# Patient Record
Sex: Female | Born: 1955
Health system: Southern US, Community
[De-identification: ages and names within clinical notes are randomized; demographics above are authoritative.]

## PROBLEM LIST (undated history)

## (undated) DIAGNOSIS — M810 Age-related osteoporosis without current pathological fracture: Secondary | ICD-10-CM

## (undated) DIAGNOSIS — F419 Anxiety disorder, unspecified: Secondary | ICD-10-CM

## (undated) DIAGNOSIS — Z90710 Acquired absence of both cervix and uterus: Secondary | ICD-10-CM

## (undated) DIAGNOSIS — F32A Depression, unspecified: Secondary | ICD-10-CM

## (undated) DIAGNOSIS — T7840XA Allergy, unspecified, initial encounter: Secondary | ICD-10-CM

## (undated) DIAGNOSIS — I10 Essential (primary) hypertension: Secondary | ICD-10-CM

## (undated) DIAGNOSIS — E7800 Pure hypercholesterolemia, unspecified: Secondary | ICD-10-CM

## (undated) DIAGNOSIS — N289 Disorder of kidney and ureter, unspecified: Secondary | ICD-10-CM

## (undated) HISTORY — PX: DILATION AND CURETTAGE OF UTERUS: SHX78

## (undated) HISTORY — PX: ABDOMINAL HYSTERECTOMY: SHX81

---

## 2001-11-30 HISTORY — PX: BREAST BIOPSY: SHX20

## 2005-01-16 ENCOUNTER — Ambulatory Visit: Payer: Self-pay | Admitting: Family Medicine

## 2005-05-07 ENCOUNTER — Ambulatory Visit: Payer: Self-pay | Admitting: Family Medicine

## 2005-05-18 ENCOUNTER — Ambulatory Visit: Payer: Self-pay | Admitting: Family Medicine

## 2006-02-10 ENCOUNTER — Ambulatory Visit: Payer: Self-pay | Admitting: Family Medicine

## 2006-06-08 ENCOUNTER — Ambulatory Visit: Payer: Self-pay | Admitting: Family Medicine

## 2006-06-10 ENCOUNTER — Ambulatory Visit: Payer: Self-pay | Admitting: Family Medicine

## 2007-07-13 ENCOUNTER — Ambulatory Visit: Payer: Self-pay | Admitting: Nurse Practitioner

## 2007-08-30 ENCOUNTER — Ambulatory Visit: Payer: Self-pay | Admitting: Gastroenterology

## 2008-08-02 ENCOUNTER — Ambulatory Visit: Payer: Self-pay | Admitting: Family Medicine

## 2009-08-06 ENCOUNTER — Ambulatory Visit: Payer: Self-pay | Admitting: Nurse Practitioner

## 2010-08-25 ENCOUNTER — Ambulatory Visit: Payer: Self-pay | Admitting: Nurse Practitioner

## 2011-10-06 ENCOUNTER — Ambulatory Visit: Payer: Self-pay

## 2013-01-17 ENCOUNTER — Ambulatory Visit: Payer: Self-pay | Admitting: Nurse Practitioner

## 2014-01-18 ENCOUNTER — Ambulatory Visit: Payer: Self-pay | Admitting: Nurse Practitioner

## 2014-01-24 ENCOUNTER — Ambulatory Visit: Payer: Self-pay | Admitting: Nurse Practitioner

## 2014-07-25 ENCOUNTER — Ambulatory Visit: Payer: Self-pay | Admitting: Nurse Practitioner

## 2016-01-08 ENCOUNTER — Other Ambulatory Visit: Payer: Self-pay | Admitting: Nurse Practitioner

## 2016-01-08 DIAGNOSIS — Z1231 Encounter for screening mammogram for malignant neoplasm of breast: Secondary | ICD-10-CM

## 2016-01-24 ENCOUNTER — Ambulatory Visit
Admission: RE | Admit: 2016-01-24 | Discharge: 2016-01-24 | Disposition: A | Discharge status: Home / Self Care | Payer: 59 | Source: Ambulatory Visit | Attending: Nurse Practitioner | Admitting: Nurse Practitioner

## 2016-01-24 ENCOUNTER — Other Ambulatory Visit: Payer: Self-pay | Admitting: Nurse Practitioner

## 2016-01-24 DIAGNOSIS — Z1231 Encounter for screening mammogram for malignant neoplasm of breast: Secondary | ICD-10-CM

## 2016-01-24 DIAGNOSIS — N63 Unspecified lump in breast: Secondary | ICD-10-CM | POA: Diagnosis not present

## 2019-05-31 DIAGNOSIS — E119 Type 2 diabetes mellitus without complications: Secondary | ICD-10-CM | POA: Insufficient documentation

## 2019-06-19 ENCOUNTER — Other Ambulatory Visit: Payer: Self-pay | Admitting: Nurse Practitioner

## 2019-06-19 DIAGNOSIS — Z1231 Encounter for screening mammogram for malignant neoplasm of breast: Secondary | ICD-10-CM

## 2020-01-25 ENCOUNTER — Ambulatory Visit
Admission: RE | Admit: 2020-01-25 | Discharge: 2020-01-25 | Disposition: A | Discharge status: Home / Self Care | Payer: Managed Care, Other (non HMO) | Source: Ambulatory Visit | Attending: Nurse Practitioner | Admitting: Nurse Practitioner

## 2020-01-25 DIAGNOSIS — Z1231 Encounter for screening mammogram for malignant neoplasm of breast: Secondary | ICD-10-CM | POA: Insufficient documentation

## 2020-04-25 DIAGNOSIS — H2511 Age-related nuclear cataract, right eye: Secondary | ICD-10-CM | POA: Diagnosis not present

## 2020-06-30 DIAGNOSIS — M810 Age-related osteoporosis without current pathological fracture: Secondary | ICD-10-CM | POA: Insufficient documentation

## 2020-07-11 DIAGNOSIS — M419 Scoliosis, unspecified: Secondary | ICD-10-CM | POA: Diagnosis not present

## 2020-07-11 DIAGNOSIS — Z0000 Encounter for general adult medical examination without abnormal findings: Secondary | ICD-10-CM | POA: Diagnosis not present

## 2020-07-11 DIAGNOSIS — E782 Mixed hyperlipidemia: Secondary | ICD-10-CM | POA: Diagnosis not present

## 2020-07-11 DIAGNOSIS — M5442 Lumbago with sciatica, left side: Secondary | ICD-10-CM | POA: Diagnosis not present

## 2020-07-11 DIAGNOSIS — E1122 Type 2 diabetes mellitus with diabetic chronic kidney disease: Secondary | ICD-10-CM | POA: Diagnosis not present

## 2020-07-11 DIAGNOSIS — G2581 Restless legs syndrome: Secondary | ICD-10-CM | POA: Diagnosis not present

## 2020-07-11 DIAGNOSIS — F329 Major depressive disorder, single episode, unspecified: Secondary | ICD-10-CM | POA: Diagnosis not present

## 2020-07-11 DIAGNOSIS — Z79899 Other long term (current) drug therapy: Secondary | ICD-10-CM | POA: Diagnosis not present

## 2020-07-11 DIAGNOSIS — N1831 Chronic kidney disease, stage 3a: Secondary | ICD-10-CM | POA: Diagnosis not present

## 2020-07-11 DIAGNOSIS — Z13820 Encounter for screening for osteoporosis: Secondary | ICD-10-CM | POA: Diagnosis not present

## 2020-07-15 DIAGNOSIS — M810 Age-related osteoporosis without current pathological fracture: Secondary | ICD-10-CM | POA: Diagnosis not present

## 2020-07-19 DIAGNOSIS — M542 Cervicalgia: Secondary | ICD-10-CM | POA: Diagnosis not present

## 2020-07-19 DIAGNOSIS — G8929 Other chronic pain: Secondary | ICD-10-CM | POA: Diagnosis not present

## 2020-07-19 DIAGNOSIS — M5442 Lumbago with sciatica, left side: Secondary | ICD-10-CM | POA: Diagnosis not present

## 2020-08-23 DIAGNOSIS — H35371 Puckering of macula, right eye: Secondary | ICD-10-CM | POA: Diagnosis not present

## 2020-08-23 DIAGNOSIS — H2511 Age-related nuclear cataract, right eye: Secondary | ICD-10-CM | POA: Diagnosis not present

## 2020-09-05 DIAGNOSIS — H2511 Age-related nuclear cataract, right eye: Secondary | ICD-10-CM | POA: Diagnosis not present

## 2020-09-05 DIAGNOSIS — I10 Essential (primary) hypertension: Secondary | ICD-10-CM | POA: Diagnosis not present

## 2020-09-10 ENCOUNTER — Encounter: Payer: Self-pay | Admitting: Ophthalmology

## 2020-09-13 ENCOUNTER — Other Ambulatory Visit: Payer: Self-pay

## 2020-09-13 ENCOUNTER — Other Ambulatory Visit
Admission: RE | Admit: 2020-09-13 | Discharge: 2020-09-13 | Disposition: A | Discharge status: Home / Self Care | Payer: Medicare HMO | Source: Ambulatory Visit | Attending: Ophthalmology | Admitting: Ophthalmology

## 2020-09-13 DIAGNOSIS — Z01812 Encounter for preprocedural laboratory examination: Secondary | ICD-10-CM | POA: Diagnosis not present

## 2020-09-13 DIAGNOSIS — Z20822 Contact with and (suspected) exposure to covid-19: Secondary | ICD-10-CM | POA: Insufficient documentation

## 2020-09-13 LAB — SARS CORONAVIRUS 2 (TAT 6-24 HRS): SARS Coronavirus 2: NEGATIVE

## 2020-09-13 NOTE — Discharge Instructions (Signed)

## 2020-09-17 ENCOUNTER — Encounter: Payer: Self-pay | Admitting: Ophthalmology

## 2020-09-17 ENCOUNTER — Other Ambulatory Visit: Payer: Self-pay

## 2020-09-17 ENCOUNTER — Ambulatory Visit: Payer: Medicare HMO | Admitting: Anesthesiology

## 2020-09-17 ENCOUNTER — Encounter
Admission: RE | Disposition: A | Discharge status: Home / Self Care | Payer: Self-pay | Source: Ambulatory Visit | Attending: Ophthalmology

## 2020-09-17 ENCOUNTER — Ambulatory Visit
Admission: RE | Admit: 2020-09-17 | Discharge: 2020-09-17 | Disposition: A | Discharge status: Home / Self Care | Payer: Medicare HMO | Source: Ambulatory Visit | Attending: Ophthalmology | Admitting: Ophthalmology

## 2020-09-17 DIAGNOSIS — F17200 Nicotine dependence, unspecified, uncomplicated: Secondary | ICD-10-CM | POA: Insufficient documentation

## 2020-09-17 DIAGNOSIS — F419 Anxiety disorder, unspecified: Secondary | ICD-10-CM | POA: Diagnosis not present

## 2020-09-17 DIAGNOSIS — H2511 Age-related nuclear cataract, right eye: Secondary | ICD-10-CM | POA: Diagnosis not present

## 2020-09-17 DIAGNOSIS — Z7983 Long term (current) use of bisphosphonates: Secondary | ICD-10-CM | POA: Diagnosis not present

## 2020-09-17 DIAGNOSIS — F32A Depression, unspecified: Secondary | ICD-10-CM | POA: Insufficient documentation

## 2020-09-17 DIAGNOSIS — Z79899 Other long term (current) drug therapy: Secondary | ICD-10-CM | POA: Insufficient documentation

## 2020-09-17 DIAGNOSIS — I10 Essential (primary) hypertension: Secondary | ICD-10-CM | POA: Insufficient documentation

## 2020-09-17 DIAGNOSIS — Z885 Allergy status to narcotic agent status: Secondary | ICD-10-CM | POA: Diagnosis not present

## 2020-09-17 DIAGNOSIS — H25811 Combined forms of age-related cataract, right eye: Secondary | ICD-10-CM | POA: Diagnosis not present

## 2020-09-17 DIAGNOSIS — E7800 Pure hypercholesterolemia, unspecified: Secondary | ICD-10-CM | POA: Insufficient documentation

## 2020-09-17 DIAGNOSIS — M810 Age-related osteoporosis without current pathological fracture: Secondary | ICD-10-CM | POA: Diagnosis not present

## 2020-09-17 HISTORY — DX: Pure hypercholesterolemia, unspecified: E78.00

## 2020-09-17 HISTORY — DX: Depression, unspecified: F32.A

## 2020-09-17 HISTORY — DX: Disorder of kidney and ureter, unspecified: N28.9

## 2020-09-17 HISTORY — DX: Acquired absence of both cervix and uterus: Z90.710

## 2020-09-17 HISTORY — DX: Age-related osteoporosis without current pathological fracture: M81.0

## 2020-09-17 HISTORY — DX: Allergy, unspecified, initial encounter: T78.40XA

## 2020-09-17 HISTORY — DX: Anxiety disorder, unspecified: F41.9

## 2020-09-17 HISTORY — DX: Essential (primary) hypertension: I10

## 2020-09-17 HISTORY — PX: CATARACT EXTRACTION W/PHACO: SHX586

## 2020-09-17 SURGERY — PHACOEMULSIFICATION, CATARACT, WITH IOL INSERTION
Anesthesia: Monitor Anesthesia Care | Site: Eye | Laterality: Right

## 2020-09-17 MED ORDER — LIDOCAINE HCL (PF) 2 % IJ SOLN
INTRAOCULAR | Status: DC | PRN
  Administered 2020-09-17: 1 mL

## 2020-09-17 MED ORDER — MOXIFLOXACIN HCL 0.5 % OP SOLN
OPHTHALMIC | Status: DC | PRN
  Administered 2020-09-17: 0.2 mL via OPHTHALMIC

## 2020-09-17 MED ORDER — EPINEPHRINE PF 1 MG/ML IJ SOLN
INTRAOCULAR | Status: DC | PRN
  Administered 2020-09-17: 59 mL via OPHTHALMIC

## 2020-09-17 MED ORDER — BRIMONIDINE TARTRATE-TIMOLOL 0.2-0.5 % OP SOLN
OPHTHALMIC | Status: DC | PRN
  Administered 2020-09-17: 1 [drp] via OPHTHALMIC

## 2020-09-17 MED ORDER — TETRACAINE HCL 0.5 % OP SOLN
1.0000 [drp] | OPHTHALMIC | Status: DC | PRN
  Administered 2020-09-17 (×3): 1 [drp] via OPHTHALMIC

## 2020-09-17 MED ORDER — MIDAZOLAM HCL 2 MG/2ML IJ SOLN
INTRAMUSCULAR | Status: DC | PRN
  Administered 2020-09-17: 1 mg via INTRAVENOUS
  Administered 2020-09-17: .5 mg via INTRAVENOUS

## 2020-09-17 MED ORDER — FENTANYL CITRATE (PF) 100 MCG/2ML IJ SOLN
INTRAMUSCULAR | Status: DC | PRN
Start: 2020-09-17 — End: 2020-09-17
  Administered 2020-09-17: 50 ug via INTRAVENOUS

## 2020-09-17 MED ORDER — ARMC OPHTHALMIC DILATING DROPS
1.0000 "application " | OPHTHALMIC | Status: DC | PRN
  Administered 2020-09-17 (×3): 1 via OPHTHALMIC

## 2020-09-17 MED ORDER — NA CHONDROIT SULF-NA HYALURON 40-17 MG/ML IO SOLN
INTRAOCULAR | Status: DC | PRN
  Administered 2020-09-17: 1 mL via INTRAOCULAR

## 2020-09-17 SURGICAL SUPPLY — 19 items
CANNULA ANT/CHMB 27G (MISCELLANEOUS) ×2 IMPLANT
CANNULA ANT/CHMB 27GA (MISCELLANEOUS) ×6 IMPLANT
GLOVE SURG LX 8.0 MICRO (GLOVE) ×2
GLOVE SURG LX STRL 8.0 MICRO (GLOVE) ×1 IMPLANT
GLOVE SURG TRIUMPH 8.0 PF LTX (GLOVE) ×3 IMPLANT
GOWN STRL REUS W/ TWL LRG LVL3 (GOWN DISPOSABLE) ×2 IMPLANT
GOWN STRL REUS W/TWL LRG LVL3 (GOWN DISPOSABLE) ×6
LENS IOL TECNIS EYHANCE 19.5 (Intraocular Lens) ×2 IMPLANT
MARKER SKIN DUAL TIP RULER LAB (MISCELLANEOUS) ×3 IMPLANT
NDL FILTER BLUNT 18X1 1/2 (NEEDLE) ×1 IMPLANT
NEEDLE FILTER BLUNT 18X 1/2SAF (NEEDLE) ×2
NEEDLE FILTER BLUNT 18X1 1/2 (NEEDLE) ×1 IMPLANT
PACK EYE AFTER SURG (MISCELLANEOUS) ×3 IMPLANT
PACK OPTHALMIC (MISCELLANEOUS) ×3 IMPLANT
PACK PORFILIO (MISCELLANEOUS) ×3 IMPLANT
SYR 3ML LL SCALE MARK (SYRINGE) ×3 IMPLANT
SYR TB 1ML LUER SLIP (SYRINGE) ×3 IMPLANT
WATER STERILE IRR 250ML POUR (IV SOLUTION) ×3 IMPLANT
WIPE NON LINTING 3.25X3.25 (MISCELLANEOUS) ×3 IMPLANT

## 2020-09-17 NOTE — Op Note (Signed)
PREOPERATIVE DIAGNOSIS:  Nuclear sclerotic cataract of the right eye.   POSTOPERATIVE DIAGNOSIS:  H25.11 Cataract   OPERATIVE PROCEDURE:@   SURGEON:  Birder Robson, MD.   ANESTHESIA:  Anesthesiologist: Ardeth Sportsman, MD CRNA: Mayme Genta, CRNA; Cameron Ali, CRNA  1.      Managed anesthesia care. 2.      0.68ml of Shugarcaine was instilled in the eye following the paracentesis.   COMPLICATIONS:  None.   TECHNIQUE:   Stop and chop   DESCRIPTION OF PROCEDURE:  The patient was examined and consented in the preoperative holding area where the aforementioned topical anesthesia was applied to the right eye and then brought back to the Operating Room where the right eye was prepped and draped in the usual sterile ophthalmic fashion and a lid speculum was placed. A paracentesis was created with the side port blade and the anterior chamber was filled with viscoelastic. A near clear corneal incision was performed with the steel keratome. A continuous curvilinear capsulorrhexis was performed with a cystotome followed by the capsulorrhexis forceps. Hydrodissection and hydrodelineation were carried out with BSS on a blunt cannula. The lens was removed in a stop and chop  technique and the remaining cortical material was removed with the irrigation-aspiration handpiece. The capsular bag was inflated with viscoelastic and the Technis [id number]  lens was placed in the capsular bag without complication. The remaining viscoelastic was removed from the eye with the irrigation-aspiration handpiece. The wounds were hydrated. The anterior chamber was flushed with BSS and the eye was inflated to physiologic pressure. 0.4ml of Vigamox was placed in the anterior chamber. The wounds were found to be water tight. The eye was dressed with Combigan. The patient was given protective glasses to wear throughout the day and a shield with which to sleep tonight. The patient was also given drops with which to begin a drop regimen  today and will follow-up with me in one day. Implant Name Type Inv. Item Serial No. Manufacturer Lot No. LRB No. Used Action  LENS IOL TECNIS EYHANCE 19.5 - [id number] Intraocular Lens LENS IOL TECNIS EYHANCE 19.5 [id number] JOHNSON   Right 1 Implanted   Procedure(s): CATARACT EXTRACTION PHACO AND INTRAOCULAR LENS PLACEMENT (IOC) RIGHT 6.40 00:42.9 (Right)  Electronically signed: Birder Robson 09/17/2020 12:34 PM

## 2020-09-17 NOTE — Anesthesia Procedure Notes (Signed)
Procedure Name: MAC Performed by: Abdishakur Gottschall, CRNA Pre-anesthesia Checklist: Patient identified, Emergency Drugs available, Suction available, Timeout performed and Patient being monitored Patient Re-evaluated:Patient Re-evaluated prior to induction Oxygen Delivery Method: Nasal cannula Placement Confirmation: positive ETCO2       

## 2020-09-17 NOTE — H&P (Signed)
[facility]   Primary Care Physician:  Sallee Lange, NP Ophthalmologist: Dr. George Ina  Pre-Procedure History & Physical: HPI:  Kim Reynolds is a 64 y.o. female here for cataract surgery.   Past Medical History:  Diagnosis Date  . Allergies   . Anxiety   . Depression   . Hx of hysterectomy   . Hypercholesterolemia   . Hypertension   . Osteoporosis   . Renal insufficiency     Past Surgical History:  Procedure Laterality Date  . ABDOMINAL HYSTERECTOMY    . BREAST BIOPSY Right 2003   neg  . DILATION AND CURETTAGE OF UTERUS      Prior to Admission medications   Medication Sig Start Date End Date Taking? Authorizing Provider  alendronate (FOSAMAX) 70 MG tablet Take 70 mg by mouth once a week. Take with a full glass of water on an empty stomach.   Yes [provider]  escitalopram (LEXAPRO) 5 MG tablet Take 5 mg by mouth daily.   Yes [provider]  loratadine (CLARITIN) 10 MG tablet Take 10 mg by mouth daily.   Yes [provider]  losartan (COZAAR) 25 MG tablet Take 25 mg by mouth daily.   Yes [provider]  rosuvastatin (CRESTOR) 5 MG tablet Take 5 mg by mouth 3 (three) times a week.   Yes [provider]    Allergies as of 08/23/2020  . (Not on File)    History reviewed. No pertinent family history.  Social History   Socioeconomic History  . Marital status: Married    Spouse name: Not on file  . Number of children: Not on file  . Years of education: Not on file  . Highest education level: Not on file  Occupational History  . Not on file  Tobacco Use  . Smoking status: Current Every Day Smoker  . Smokeless tobacco: Never Used  Substance and Sexual Activity  . Alcohol use: Not on file  . Drug use: Not on file  . Sexual activity: Not on file  Other Topics Concern  . Not on file  Social History Narrative  . Not on file   Social Determinants of Health   Financial Resource Strain:   .  Difficulty of Paying Living Expenses: Not on file  Food Insecurity:   . Worried About Charity fundraiser in the Last Year: Not on file  . Ran Out of Food in the Last Year: Not on file  Transportation Needs:   . Lack of Transportation (Medical): Not on file  . Lack of Transportation (Non-Medical): Not on file  Physical Activity:   . Days of Exercise per Week: Not on file  . Minutes of Exercise per Session: Not on file  Stress:   . Feeling of Stress : Not on file  Social Connections:   . Frequency of Communication with Friends and Family: Not on file  . Frequency of Social Gatherings with Friends and Family: Not on file  . Attends Religious Services: Not on file  . Active Member of Clubs or Organizations: Not on file  . Attends Archivist Meetings: Not on file  . Marital Status: Not on file  Intimate Partner Violence:   . Fear of Current or Ex-Partner: Not on file  . Emotionally Abused: Not on file  . Physically Abused: Not on file  . Sexually Abused: Not on file    Review of Systems: See HPI, otherwise negative ROS  Physical Exam: BP Marland Kitchen)  153/74   Pulse 62   Temp 97.7 F (36.5 C) (Temporal)   Ht 5\' 4"  (1.626 m)   Wt 66.7 kg   SpO2 99%   BMI 25.23 kg/m  General:   Alert,  pleasant and cooperative in NAD Head:  Normocephalic and atraumatic. Lungs:  Clear to auscultation.    Heart:  Regular rate and rhythm.   Impression/Plan: Kim Reynolds is here for cataract surgery.  Risks, benefits, limitations, and alternatives regarding cataract surgery have been reviewed with the patient.  Questions have been answered.  All parties agreeable.   Birder Robson, MD  09/17/2020, 12:10 PM

## 2020-09-17 NOTE — Transfer of Care (Signed)
Immediate Anesthesia Transfer of Care Note  Patient: Kim Reynolds  Procedure(s) Performed: CATARACT EXTRACTION PHACO AND INTRAOCULAR LENS PLACEMENT (IOC) RIGHT 6.40 00:42.9 (Right Eye)  Patient Location: PACU  Anesthesia Type: MAC  Level of Consciousness: awake, alert  and patient cooperative  Airway and Oxygen Therapy: Patient Spontanous Breathing and Patient connected to supplemental oxygen  Post-op Assessment: Post-op Vital signs reviewed, Patient's Cardiovascular Status Stable, Respiratory Function Stable, Patent Airway and No signs of Nausea or vomiting  Post-op Vital Signs: Reviewed and stable  Complications: No complications documented.

## 2020-09-17 NOTE — Anesthesia Preprocedure Evaluation (Signed)
Anesthesia Evaluation  Patient identified by MRN, date of birth, ID band Patient awake    Reviewed: Allergy & Precautions, NPO status , Patient's Chart, lab work & pertinent test results  Airway Mallampati: II  TM Distance: >3 FB Neck ROM: Full    Dental no notable dental hx.    Pulmonary Current Smoker,    Pulmonary exam normal        Cardiovascular hypertension, Pt. on medications Normal cardiovascular exam     Neuro/Psych Anxiety Depression negative neurological ROS     GI/Hepatic negative GI ROS, Neg liver ROS,   Endo/Other  negative endocrine ROS  Renal/GU Renal InsufficiencyRenal disease     Musculoskeletal negative musculoskeletal ROS (+)   Abdominal Normal abdominal exam  (+)   Peds  Hematology negative hematology ROS (+)   Anesthesia Other Findings   Reproductive/Obstetrics                             Anesthesia Physical Anesthesia Plan  ASA: II  Anesthesia Plan: MAC   Post-op Pain Management:    Induction: Intravenous  PONV Risk Score and Plan: 1 and TIVA, Midazolam and Treatment may vary due to age or medical condition  Airway Management Planned: Nasal Cannula and Natural Airway  Additional Equipment:   Intra-op Plan:   Post-operative Plan:   Informed Consent: I have reviewed the patients History and Physical, chart, labs and discussed the procedure including the risks, benefits and alternatives for the proposed anesthesia with the patient or authorized representative who has indicated his/her understanding and acceptance.     Dental advisory given  Plan Discussed with: CRNA  Anesthesia Plan Comments:         Anesthesia Quick Evaluation

## 2020-09-17 NOTE — Anesthesia Postprocedure Evaluation (Signed)
Anesthesia Post Note  Patient: Kim Reynolds  Procedure(s) Performed: CATARACT EXTRACTION PHACO AND INTRAOCULAR LENS PLACEMENT (IOC) RIGHT 6.40 00:42.9 (Right Eye)     Patient location during evaluation: PACU Anesthesia Type: MAC Level of consciousness: awake and alert Pain management: pain level controlled Vital Signs Assessment: post-procedure vital signs reviewed and stable Respiratory status: nonlabored ventilation and spontaneous breathing Cardiovascular status: stable Postop Assessment: no apparent nausea or vomiting Anesthetic complications: no   No complications documented.  Shiri Hodapp [employer]

## 2020-09-18 ENCOUNTER — Encounter: Payer: Self-pay | Admitting: Ophthalmology

## 2020-11-01 DIAGNOSIS — Z961 Presence of intraocular lens: Secondary | ICD-10-CM | POA: Diagnosis not present

## 2020-12-20 ENCOUNTER — Other Ambulatory Visit: Payer: Self-pay | Admitting: Nurse Practitioner

## 2020-12-20 DIAGNOSIS — Z1231 Encounter for screening mammogram for malignant neoplasm of breast: Secondary | ICD-10-CM

## 2021-01-14 DIAGNOSIS — M5442 Lumbago with sciatica, left side: Secondary | ICD-10-CM | POA: Diagnosis not present

## 2021-01-14 DIAGNOSIS — Z79899 Other long term (current) drug therapy: Secondary | ICD-10-CM | POA: Diagnosis not present

## 2021-01-14 DIAGNOSIS — M545 Low back pain, unspecified: Secondary | ICD-10-CM | POA: Diagnosis not present

## 2021-01-14 DIAGNOSIS — M419 Scoliosis, unspecified: Secondary | ICD-10-CM | POA: Diagnosis not present

## 2021-01-14 DIAGNOSIS — G8929 Other chronic pain: Secondary | ICD-10-CM | POA: Diagnosis not present

## 2021-01-14 DIAGNOSIS — E1122 Type 2 diabetes mellitus with diabetic chronic kidney disease: Secondary | ICD-10-CM | POA: Diagnosis not present

## 2021-01-14 DIAGNOSIS — N1831 Chronic kidney disease, stage 3a: Secondary | ICD-10-CM | POA: Diagnosis not present

## 2021-01-14 DIAGNOSIS — E782 Mixed hyperlipidemia: Secondary | ICD-10-CM | POA: Diagnosis not present

## 2021-01-14 DIAGNOSIS — I129 Hypertensive chronic kidney disease with stage 1 through stage 4 chronic kidney disease, or unspecified chronic kidney disease: Secondary | ICD-10-CM | POA: Diagnosis not present

## 2021-01-14 DIAGNOSIS — M810 Age-related osteoporosis without current pathological fracture: Secondary | ICD-10-CM | POA: Diagnosis not present

## 2021-01-27 ENCOUNTER — Other Ambulatory Visit: Payer: Self-pay

## 2021-01-27 ENCOUNTER — Ambulatory Visit
Admission: RE | Admit: 2021-01-27 | Discharge: 2021-01-27 | Disposition: A | Discharge status: Home / Self Care | Payer: Medicare HMO | Source: Ambulatory Visit | Attending: Nurse Practitioner | Admitting: Nurse Practitioner

## 2021-01-27 DIAGNOSIS — Z1231 Encounter for screening mammogram for malignant neoplasm of breast: Secondary | ICD-10-CM | POA: Diagnosis not present

## 2021-05-01 DIAGNOSIS — H2512 Age-related nuclear cataract, left eye: Secondary | ICD-10-CM | POA: Diagnosis not present

## 2021-07-18 DIAGNOSIS — Z1389 Encounter for screening for other disorder: Secondary | ICD-10-CM | POA: Diagnosis not present

## 2021-07-18 DIAGNOSIS — F32A Depression, unspecified: Secondary | ICD-10-CM | POA: Diagnosis not present

## 2021-07-18 DIAGNOSIS — E1122 Type 2 diabetes mellitus with diabetic chronic kidney disease: Secondary | ICD-10-CM | POA: Diagnosis not present

## 2021-07-18 DIAGNOSIS — E782 Mixed hyperlipidemia: Secondary | ICD-10-CM | POA: Diagnosis not present

## 2021-07-18 DIAGNOSIS — Z0000 Encounter for general adult medical examination without abnormal findings: Secondary | ICD-10-CM | POA: Diagnosis not present

## 2021-07-18 DIAGNOSIS — M5442 Lumbago with sciatica, left side: Secondary | ICD-10-CM | POA: Diagnosis not present

## 2021-07-18 DIAGNOSIS — N1831 Chronic kidney disease, stage 3a: Secondary | ICD-10-CM | POA: Diagnosis not present

## 2021-07-18 DIAGNOSIS — M810 Age-related osteoporosis without current pathological fracture: Secondary | ICD-10-CM | POA: Diagnosis not present

## 2021-07-18 DIAGNOSIS — I10 Essential (primary) hypertension: Secondary | ICD-10-CM | POA: Diagnosis not present

## 2021-07-18 DIAGNOSIS — I129 Hypertensive chronic kidney disease with stage 1 through stage 4 chronic kidney disease, or unspecified chronic kidney disease: Secondary | ICD-10-CM | POA: Diagnosis not present

## 2021-07-18 DIAGNOSIS — Z79899 Other long term (current) drug therapy: Secondary | ICD-10-CM | POA: Diagnosis not present

## 2021-08-14 DIAGNOSIS — M419 Scoliosis, unspecified: Secondary | ICD-10-CM | POA: Diagnosis not present

## 2021-08-14 DIAGNOSIS — M5416 Radiculopathy, lumbar region: Secondary | ICD-10-CM | POA: Diagnosis not present

## 2021-09-25 DIAGNOSIS — M419 Scoliosis, unspecified: Secondary | ICD-10-CM | POA: Diagnosis not present

## 2021-09-25 DIAGNOSIS — M5416 Radiculopathy, lumbar region: Secondary | ICD-10-CM | POA: Diagnosis not present

## 2021-12-17 DIAGNOSIS — R1031 Right lower quadrant pain: Secondary | ICD-10-CM | POA: Diagnosis not present

## 2021-12-17 DIAGNOSIS — R194 Change in bowel habit: Secondary | ICD-10-CM | POA: Diagnosis not present

## 2021-12-24 ENCOUNTER — Other Ambulatory Visit: Payer: Self-pay | Admitting: Nurse Practitioner

## 2021-12-24 DIAGNOSIS — Z1231 Encounter for screening mammogram for malignant neoplasm of breast: Secondary | ICD-10-CM

## 2022-01-15 DIAGNOSIS — F17200 Nicotine dependence, unspecified, uncomplicated: Secondary | ICD-10-CM | POA: Diagnosis not present

## 2022-01-15 DIAGNOSIS — R1031 Right lower quadrant pain: Secondary | ICD-10-CM | POA: Diagnosis not present

## 2022-01-15 DIAGNOSIS — R194 Change in bowel habit: Secondary | ICD-10-CM | POA: Diagnosis not present

## 2022-01-15 DIAGNOSIS — R10823 Right lower quadrant rebound abdominal tenderness: Secondary | ICD-10-CM | POA: Diagnosis not present

## 2022-01-20 DIAGNOSIS — M5442 Lumbago with sciatica, left side: Secondary | ICD-10-CM | POA: Diagnosis not present

## 2022-01-20 DIAGNOSIS — K5900 Constipation, unspecified: Secondary | ICD-10-CM | POA: Diagnosis not present

## 2022-01-20 DIAGNOSIS — E782 Mixed hyperlipidemia: Secondary | ICD-10-CM | POA: Diagnosis not present

## 2022-01-20 DIAGNOSIS — N1831 Chronic kidney disease, stage 3a: Secondary | ICD-10-CM | POA: Diagnosis not present

## 2022-01-20 DIAGNOSIS — I129 Hypertensive chronic kidney disease with stage 1 through stage 4 chronic kidney disease, or unspecified chronic kidney disease: Secondary | ICD-10-CM | POA: Diagnosis not present

## 2022-01-20 DIAGNOSIS — M419 Scoliosis, unspecified: Secondary | ICD-10-CM | POA: Diagnosis not present

## 2022-01-20 DIAGNOSIS — F32A Depression, unspecified: Secondary | ICD-10-CM | POA: Diagnosis not present

## 2022-01-20 DIAGNOSIS — E1122 Type 2 diabetes mellitus with diabetic chronic kidney disease: Secondary | ICD-10-CM | POA: Diagnosis not present

## 2022-01-20 DIAGNOSIS — R10813 Right lower quadrant abdominal tenderness: Secondary | ICD-10-CM | POA: Diagnosis not present

## 2022-01-29 ENCOUNTER — Ambulatory Visit
Admission: RE | Admit: 2022-01-29 | Discharge: 2022-01-29 | Disposition: A | Discharge status: Home / Self Care | Payer: Medicare HMO | Source: Ambulatory Visit | Attending: Nurse Practitioner | Admitting: Nurse Practitioner

## 2022-01-29 ENCOUNTER — Other Ambulatory Visit: Payer: Self-pay

## 2022-01-29 DIAGNOSIS — Z1231 Encounter for screening mammogram for malignant neoplasm of breast: Secondary | ICD-10-CM | POA: Insufficient documentation

## 2022-03-08 IMAGING — MG MM DIGITAL SCREENING BILAT W/ TOMO AND CAD
8 series · 8 of 24 positions shown · non-contrast
Comparison: Previous exam(s).

CLINICAL DATA: Screening.

EXAM:
DIGITAL SCREENING BILATERAL MAMMOGRAM WITH TOMOSYNTHESIS AND CAD
TECHNIQUE: Bilateral screening digital craniocaudal and mediolateral oblique
mammograms were obtained. Bilateral screening digital breast
tomosynthesis was performed. The images were evaluated with
computer-aided detection.

[L CC synth-2D]
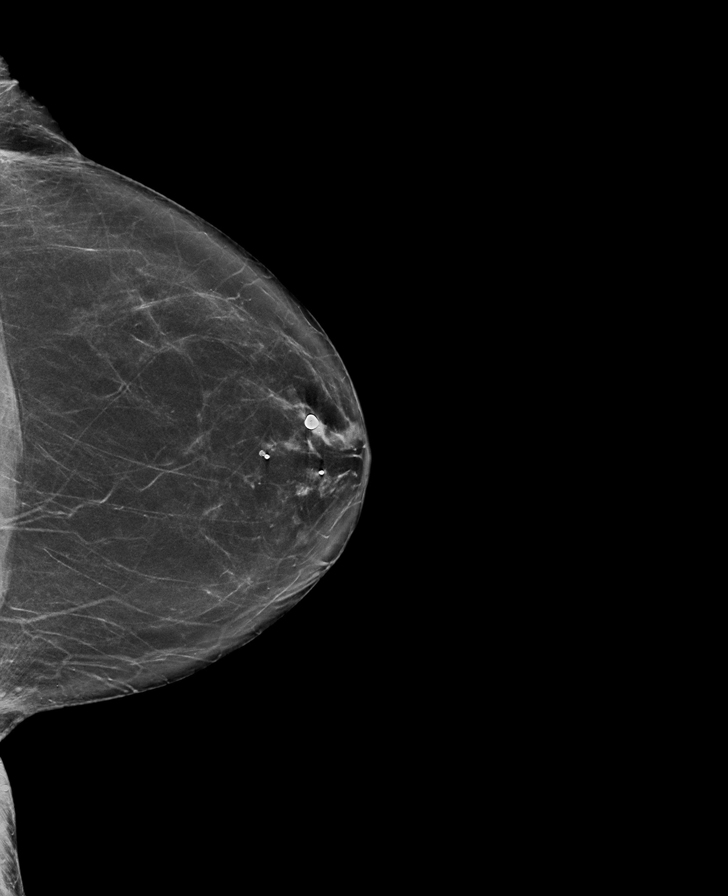

[R CC synth-2D]
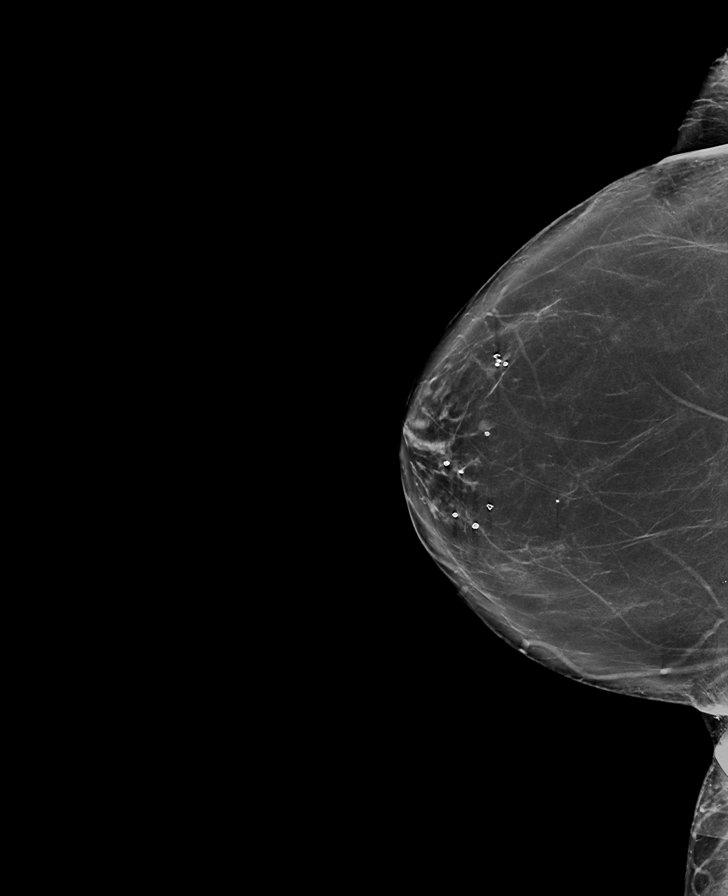

[R MLO synth-2D]
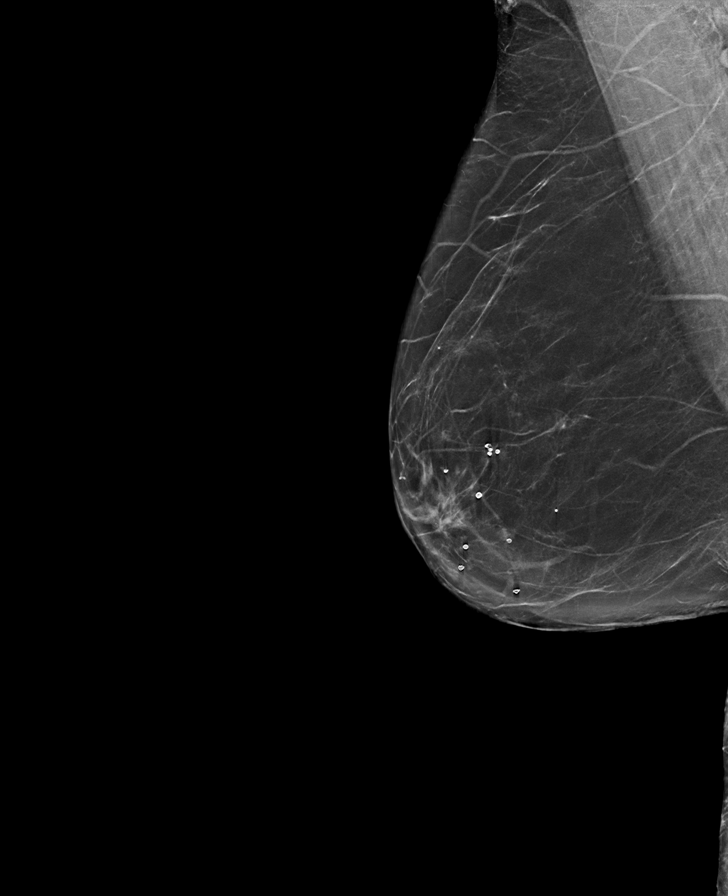

[L MLO synth-2D]
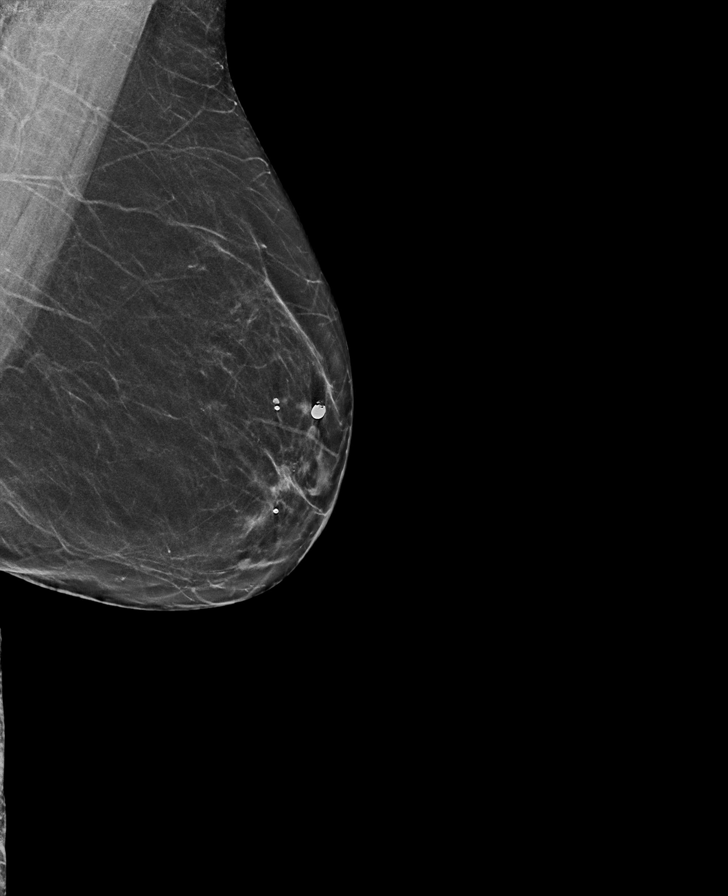

[R MLO tomo · tomo slice 39/77.0]
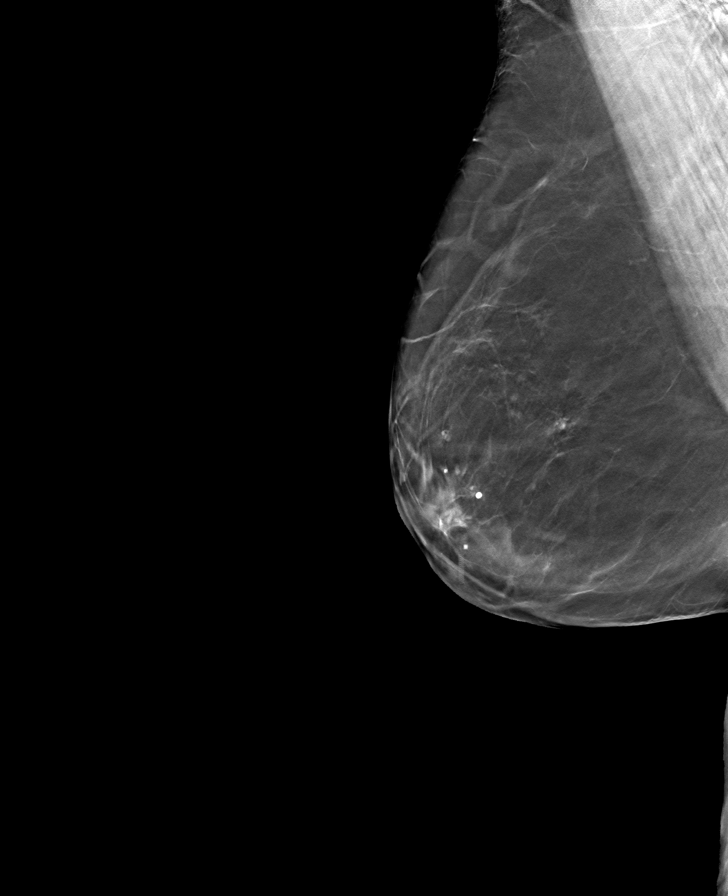

[L MLO tomo · tomo slice 29/58.0]
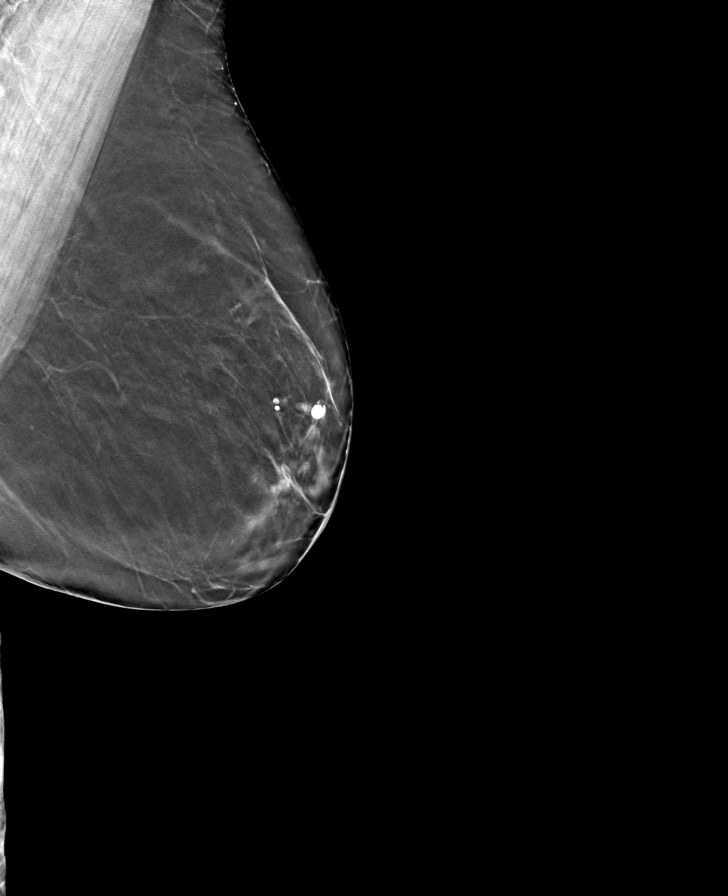

[L CC tomo · tomo slice 35/68.0]
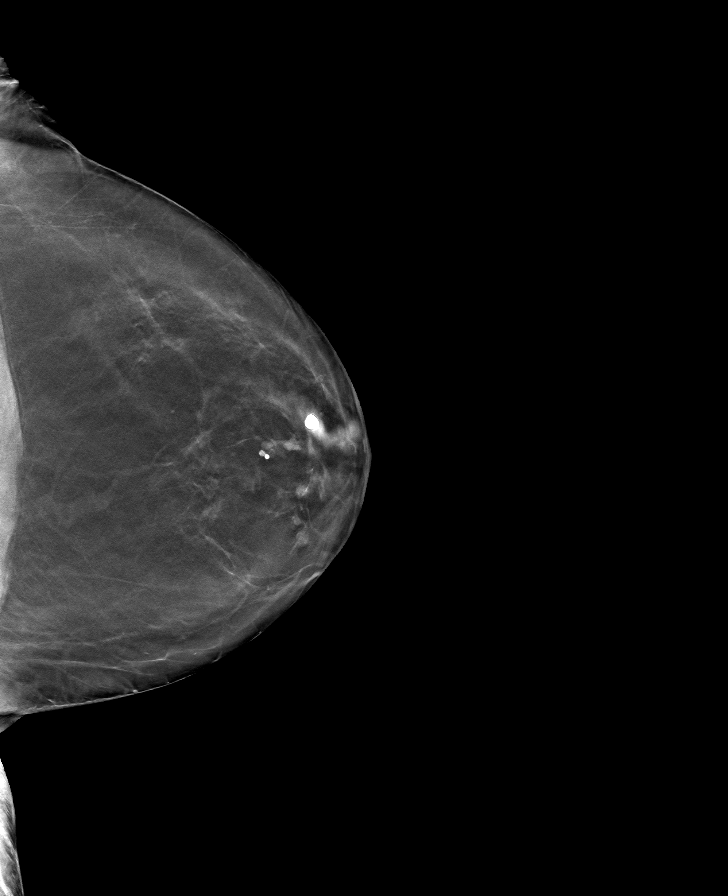

[R CC tomo · tomo slice 41/82.0]
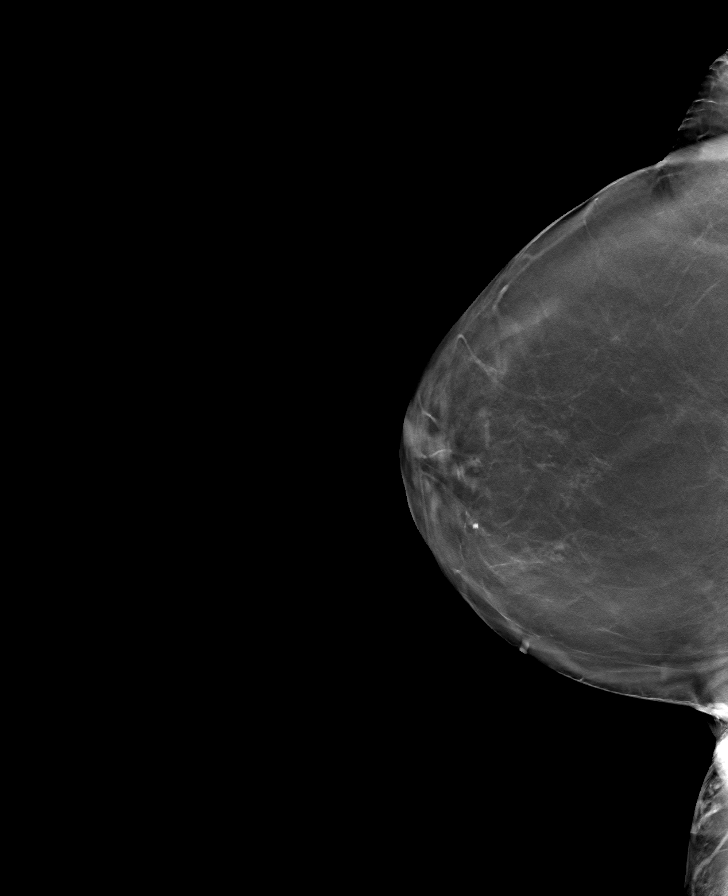

[8 of 24 positions shown; findings below may reference images not displayed]

ACR Breast Density Category b: There are scattered areas of
fibroglandular density.
FINDINGS: There are no findings suspicious for malignancy.
IMPRESSION: No mammographic evidence of malignancy. A result letter of this
screening mammogram will be mailed directly to the patient.

RECOMMENDATION:
Screening mammogram in one year. (Code:51-O-LD2)

BI-RADS CATEGORY  1: Negative.

## 2022-03-19 DIAGNOSIS — M546 Pain in thoracic spine: Secondary | ICD-10-CM | POA: Diagnosis not present

## 2022-03-19 DIAGNOSIS — M62830 Muscle spasm of back: Secondary | ICD-10-CM | POA: Diagnosis not present

## 2022-03-19 DIAGNOSIS — G8929 Other chronic pain: Secondary | ICD-10-CM | POA: Diagnosis not present

## 2022-03-19 DIAGNOSIS — R399 Unspecified symptoms and signs involving the genitourinary system: Secondary | ICD-10-CM | POA: Diagnosis not present

## 2022-05-06 DIAGNOSIS — H2512 Age-related nuclear cataract, left eye: Secondary | ICD-10-CM | POA: Diagnosis not present

## 2022-06-24 DIAGNOSIS — M3501 Sicca syndrome with keratoconjunctivitis: Secondary | ICD-10-CM | POA: Diagnosis not present

## 2022-08-28 DIAGNOSIS — I10 Essential (primary) hypertension: Secondary | ICD-10-CM | POA: Diagnosis not present

## 2022-08-28 DIAGNOSIS — M546 Pain in thoracic spine: Secondary | ICD-10-CM | POA: Diagnosis not present

## 2022-08-28 DIAGNOSIS — Z1331 Encounter for screening for depression: Secondary | ICD-10-CM | POA: Diagnosis not present

## 2022-08-28 DIAGNOSIS — E1122 Type 2 diabetes mellitus with diabetic chronic kidney disease: Secondary | ICD-10-CM | POA: Diagnosis not present

## 2022-08-28 DIAGNOSIS — Z0000 Encounter for general adult medical examination without abnormal findings: Secondary | ICD-10-CM | POA: Diagnosis not present

## 2022-08-28 DIAGNOSIS — F32A Depression, unspecified: Secondary | ICD-10-CM | POA: Diagnosis not present

## 2022-08-28 DIAGNOSIS — L989 Disorder of the skin and subcutaneous tissue, unspecified: Secondary | ICD-10-CM | POA: Diagnosis not present

## 2022-08-28 DIAGNOSIS — I129 Hypertensive chronic kidney disease with stage 1 through stage 4 chronic kidney disease, or unspecified chronic kidney disease: Secondary | ICD-10-CM | POA: Diagnosis not present

## 2022-08-28 DIAGNOSIS — E782 Mixed hyperlipidemia: Secondary | ICD-10-CM | POA: Diagnosis not present

## 2022-08-28 DIAGNOSIS — M810 Age-related osteoporosis without current pathological fracture: Secondary | ICD-10-CM | POA: Diagnosis not present

## 2022-08-28 DIAGNOSIS — N1831 Chronic kidney disease, stage 3a: Secondary | ICD-10-CM | POA: Diagnosis not present

## 2022-08-28 DIAGNOSIS — Z79899 Other long term (current) drug therapy: Secondary | ICD-10-CM | POA: Diagnosis not present

## 2022-09-21 DIAGNOSIS — M810 Age-related osteoporosis without current pathological fracture: Secondary | ICD-10-CM | POA: Diagnosis not present

## 2022-10-05 DIAGNOSIS — Z720 Tobacco use: Secondary | ICD-10-CM | POA: Diagnosis not present

## 2022-10-05 DIAGNOSIS — E559 Vitamin D deficiency, unspecified: Secondary | ICD-10-CM | POA: Diagnosis not present

## 2022-10-05 DIAGNOSIS — M810 Age-related osteoporosis without current pathological fracture: Secondary | ICD-10-CM | POA: Diagnosis not present

## 2022-10-09 DIAGNOSIS — M810 Age-related osteoporosis without current pathological fracture: Secondary | ICD-10-CM | POA: Diagnosis not present

## 2022-12-04 DIAGNOSIS — D0471 Carcinoma in situ of skin of right lower limb, including hip: Secondary | ICD-10-CM | POA: Diagnosis not present

## 2022-12-04 DIAGNOSIS — D485 Neoplasm of uncertain behavior of skin: Secondary | ICD-10-CM | POA: Diagnosis not present

## 2022-12-25 DIAGNOSIS — D0471 Carcinoma in situ of skin of right lower limb, including hip: Secondary | ICD-10-CM | POA: Diagnosis not present

## 2023-02-17 DIAGNOSIS — E119 Type 2 diabetes mellitus without complications: Secondary | ICD-10-CM | POA: Diagnosis not present

## 2023-02-17 DIAGNOSIS — Z79899 Other long term (current) drug therapy: Secondary | ICD-10-CM | POA: Diagnosis not present

## 2023-02-17 DIAGNOSIS — N1831 Chronic kidney disease, stage 3a: Secondary | ICD-10-CM | POA: Diagnosis not present

## 2023-02-17 DIAGNOSIS — I10 Essential (primary) hypertension: Secondary | ICD-10-CM | POA: Diagnosis not present

## 2023-02-17 DIAGNOSIS — F17210 Nicotine dependence, cigarettes, uncomplicated: Secondary | ICD-10-CM | POA: Diagnosis not present

## 2023-02-17 DIAGNOSIS — R0789 Other chest pain: Secondary | ICD-10-CM | POA: Diagnosis not present

## 2023-02-17 DIAGNOSIS — F17200 Nicotine dependence, unspecified, uncomplicated: Secondary | ICD-10-CM | POA: Diagnosis not present

## 2023-02-17 DIAGNOSIS — Z8349 Family history of other endocrine, nutritional and metabolic diseases: Secondary | ICD-10-CM | POA: Diagnosis not present

## 2023-02-17 DIAGNOSIS — Z9189 Other specified personal risk factors, not elsewhere classified: Secondary | ICD-10-CM | POA: Diagnosis not present

## 2023-02-17 DIAGNOSIS — E1122 Type 2 diabetes mellitus with diabetic chronic kidney disease: Secondary | ICD-10-CM | POA: Diagnosis not present

## 2023-02-17 DIAGNOSIS — E782 Mixed hyperlipidemia: Secondary | ICD-10-CM | POA: Diagnosis not present

## 2023-02-24 DIAGNOSIS — Z720 Tobacco use: Secondary | ICD-10-CM | POA: Diagnosis not present

## 2023-02-24 DIAGNOSIS — N1831 Chronic kidney disease, stage 3a: Secondary | ICD-10-CM | POA: Diagnosis not present

## 2023-02-24 DIAGNOSIS — E1122 Type 2 diabetes mellitus with diabetic chronic kidney disease: Secondary | ICD-10-CM | POA: Diagnosis not present

## 2023-02-24 DIAGNOSIS — E782 Mixed hyperlipidemia: Secondary | ICD-10-CM | POA: Diagnosis not present

## 2023-02-24 DIAGNOSIS — R079 Chest pain, unspecified: Secondary | ICD-10-CM | POA: Diagnosis not present

## 2023-02-24 DIAGNOSIS — R0602 Shortness of breath: Secondary | ICD-10-CM | POA: Diagnosis not present

## 2023-03-01 DIAGNOSIS — Z1231 Encounter for screening mammogram for malignant neoplasm of breast: Secondary | ICD-10-CM | POA: Diagnosis not present

## 2023-03-01 DIAGNOSIS — E782 Mixed hyperlipidemia: Secondary | ICD-10-CM | POA: Diagnosis not present

## 2023-03-01 DIAGNOSIS — E1122 Type 2 diabetes mellitus with diabetic chronic kidney disease: Secondary | ICD-10-CM | POA: Diagnosis not present

## 2023-03-01 DIAGNOSIS — F17210 Nicotine dependence, cigarettes, uncomplicated: Secondary | ICD-10-CM | POA: Diagnosis not present

## 2023-03-01 DIAGNOSIS — Z79899 Other long term (current) drug therapy: Secondary | ICD-10-CM | POA: Diagnosis not present

## 2023-03-01 DIAGNOSIS — I129 Hypertensive chronic kidney disease with stage 1 through stage 4 chronic kidney disease, or unspecified chronic kidney disease: Secondary | ICD-10-CM | POA: Diagnosis not present

## 2023-03-01 DIAGNOSIS — M810 Age-related osteoporosis without current pathological fracture: Secondary | ICD-10-CM | POA: Diagnosis not present

## 2023-03-01 DIAGNOSIS — N1831 Chronic kidney disease, stage 3a: Secondary | ICD-10-CM | POA: Diagnosis not present

## 2023-03-04 ENCOUNTER — Other Ambulatory Visit: Payer: Self-pay

## 2023-03-04 DIAGNOSIS — Z1231 Encounter for screening mammogram for malignant neoplasm of breast: Secondary | ICD-10-CM

## 2023-03-15 DIAGNOSIS — R079 Chest pain, unspecified: Secondary | ICD-10-CM | POA: Diagnosis not present

## 2023-03-15 DIAGNOSIS — R0602 Shortness of breath: Secondary | ICD-10-CM | POA: Diagnosis not present

## 2023-03-23 DIAGNOSIS — E782 Mixed hyperlipidemia: Secondary | ICD-10-CM | POA: Diagnosis not present

## 2023-03-23 DIAGNOSIS — R0789 Other chest pain: Secondary | ICD-10-CM | POA: Diagnosis not present

## 2023-03-23 DIAGNOSIS — R0609 Other forms of dyspnea: Secondary | ICD-10-CM | POA: Diagnosis not present

## 2023-03-23 DIAGNOSIS — Z720 Tobacco use: Secondary | ICD-10-CM | POA: Diagnosis not present

## 2023-03-24 ENCOUNTER — Ambulatory Visit
Admission: RE | Admit: 2023-03-24 | Discharge: 2023-03-24 | Disposition: A | Discharge status: Home / Self Care | Payer: Medicare HMO | Source: Ambulatory Visit | Attending: Nurse Practitioner | Admitting: Nurse Practitioner

## 2023-03-24 DIAGNOSIS — Z1231 Encounter for screening mammogram for malignant neoplasm of breast: Secondary | ICD-10-CM | POA: Diagnosis not present

## 2023-04-07 DIAGNOSIS — N1831 Chronic kidney disease, stage 3a: Secondary | ICD-10-CM | POA: Diagnosis not present

## 2023-04-07 DIAGNOSIS — E1122 Type 2 diabetes mellitus with diabetic chronic kidney disease: Secondary | ICD-10-CM | POA: Diagnosis not present

## 2023-04-07 DIAGNOSIS — E782 Mixed hyperlipidemia: Secondary | ICD-10-CM | POA: Diagnosis not present

## 2023-04-09 DIAGNOSIS — M810 Age-related osteoporosis without current pathological fracture: Secondary | ICD-10-CM | POA: Diagnosis not present

## 2023-04-21 DIAGNOSIS — N1831 Chronic kidney disease, stage 3a: Secondary | ICD-10-CM | POA: Diagnosis not present

## 2023-05-10 DIAGNOSIS — H02889 Meibomian gland dysfunction of unspecified eye, unspecified eyelid: Secondary | ICD-10-CM | POA: Diagnosis not present

## 2023-05-10 DIAGNOSIS — H5702 Anisocoria: Secondary | ICD-10-CM | POA: Diagnosis not present

## 2023-05-10 DIAGNOSIS — M3501 Sicca syndrome with keratoconjunctivitis: Secondary | ICD-10-CM | POA: Diagnosis not present

## 2023-05-10 DIAGNOSIS — H2512 Age-related nuclear cataract, left eye: Secondary | ICD-10-CM | POA: Diagnosis not present

## 2023-05-26 DIAGNOSIS — M438X6 Other specified deforming dorsopathies, lumbar region: Secondary | ICD-10-CM | POA: Diagnosis not present

## 2023-05-26 DIAGNOSIS — M549 Dorsalgia, unspecified: Secondary | ICD-10-CM | POA: Diagnosis not present

## 2023-05-26 DIAGNOSIS — M25552 Pain in left hip: Secondary | ICD-10-CM | POA: Diagnosis not present

## 2023-05-26 DIAGNOSIS — M50222 Other cervical disc displacement at C5-C6 level: Secondary | ICD-10-CM | POA: Diagnosis not present

## 2023-05-26 DIAGNOSIS — M5126 Other intervertebral disc displacement, lumbar region: Secondary | ICD-10-CM | POA: Diagnosis not present

## 2023-05-26 DIAGNOSIS — M5136 Other intervertebral disc degeneration, lumbar region: Secondary | ICD-10-CM | POA: Diagnosis not present

## 2023-05-26 DIAGNOSIS — M5030 Other cervical disc degeneration, unspecified cervical region: Secondary | ICD-10-CM | POA: Diagnosis not present

## 2023-05-26 DIAGNOSIS — M47812 Spondylosis without myelopathy or radiculopathy, cervical region: Secondary | ICD-10-CM | POA: Diagnosis not present

## 2023-06-02 DIAGNOSIS — M5459 Other low back pain: Secondary | ICD-10-CM | POA: Diagnosis not present

## 2023-06-21 DIAGNOSIS — M5459 Other low back pain: Secondary | ICD-10-CM | POA: Diagnosis not present

## 2023-07-05 DIAGNOSIS — M25552 Pain in left hip: Secondary | ICD-10-CM | POA: Diagnosis not present

## 2023-07-05 DIAGNOSIS — M5459 Other low back pain: Secondary | ICD-10-CM | POA: Diagnosis not present

## 2023-07-05 DIAGNOSIS — M5136 Other intervertebral disc degeneration, lumbar region: Secondary | ICD-10-CM | POA: Diagnosis not present

## 2023-07-26 DIAGNOSIS — M5442 Lumbago with sciatica, left side: Secondary | ICD-10-CM | POA: Diagnosis not present

## 2023-07-26 DIAGNOSIS — G8929 Other chronic pain: Secondary | ICD-10-CM | POA: Diagnosis not present

## 2023-07-26 DIAGNOSIS — M5416 Radiculopathy, lumbar region: Secondary | ICD-10-CM | POA: Diagnosis not present

## 2023-07-27 ENCOUNTER — Other Ambulatory Visit: Payer: Self-pay | Admitting: Physical Medicine & Rehabilitation

## 2023-07-27 DIAGNOSIS — G8929 Other chronic pain: Secondary | ICD-10-CM

## 2023-08-24 ENCOUNTER — Ambulatory Visit
Admission: RE | Admit: 2023-08-24 | Discharge: 2023-08-24 | Disposition: A | Discharge status: Home / Self Care | Payer: Medicare HMO | Source: Ambulatory Visit | Attending: Physical Medicine & Rehabilitation | Admitting: Physical Medicine & Rehabilitation

## 2023-08-24 DIAGNOSIS — G8929 Other chronic pain: Secondary | ICD-10-CM

## 2023-08-24 DIAGNOSIS — M419 Scoliosis, unspecified: Secondary | ICD-10-CM | POA: Diagnosis not present

## 2023-08-24 DIAGNOSIS — M48061 Spinal stenosis, lumbar region without neurogenic claudication: Secondary | ICD-10-CM | POA: Diagnosis not present

## 2023-09-03 DIAGNOSIS — Z79899 Other long term (current) drug therapy: Secondary | ICD-10-CM | POA: Diagnosis not present

## 2023-09-03 DIAGNOSIS — M810 Age-related osteoporosis without current pathological fracture: Secondary | ICD-10-CM | POA: Diagnosis not present

## 2023-09-03 DIAGNOSIS — Z0000 Encounter for general adult medical examination without abnormal findings: Secondary | ICD-10-CM | POA: Diagnosis not present

## 2023-09-03 DIAGNOSIS — F32A Depression, unspecified: Secondary | ICD-10-CM | POA: Diagnosis not present

## 2023-09-03 DIAGNOSIS — Z1331 Encounter for screening for depression: Secondary | ICD-10-CM | POA: Diagnosis not present

## 2023-09-03 DIAGNOSIS — I129 Hypertensive chronic kidney disease with stage 1 through stage 4 chronic kidney disease, or unspecified chronic kidney disease: Secondary | ICD-10-CM | POA: Diagnosis not present

## 2023-09-03 DIAGNOSIS — E782 Mixed hyperlipidemia: Secondary | ICD-10-CM | POA: Diagnosis not present

## 2023-09-03 DIAGNOSIS — E1122 Type 2 diabetes mellitus with diabetic chronic kidney disease: Secondary | ICD-10-CM | POA: Diagnosis not present

## 2023-09-03 DIAGNOSIS — F17210 Nicotine dependence, cigarettes, uncomplicated: Secondary | ICD-10-CM | POA: Diagnosis not present

## 2023-09-16 DIAGNOSIS — G8929 Other chronic pain: Secondary | ICD-10-CM | POA: Diagnosis not present

## 2023-09-16 DIAGNOSIS — M5442 Lumbago with sciatica, left side: Secondary | ICD-10-CM | POA: Diagnosis not present

## 2023-09-16 DIAGNOSIS — M5416 Radiculopathy, lumbar region: Secondary | ICD-10-CM | POA: Diagnosis not present

## 2023-09-24 DIAGNOSIS — M5416 Radiculopathy, lumbar region: Secondary | ICD-10-CM | POA: Diagnosis not present

## 2023-09-24 DIAGNOSIS — E1122 Type 2 diabetes mellitus with diabetic chronic kidney disease: Secondary | ICD-10-CM | POA: Diagnosis not present

## 2023-09-24 DIAGNOSIS — N1831 Chronic kidney disease, stage 3a: Secondary | ICD-10-CM | POA: Diagnosis not present

## 2023-10-01 DIAGNOSIS — I10 Essential (primary) hypertension: Secondary | ICD-10-CM | POA: Diagnosis not present

## 2023-10-01 DIAGNOSIS — Z79899 Other long term (current) drug therapy: Secondary | ICD-10-CM | POA: Diagnosis not present

## 2023-10-01 DIAGNOSIS — E1122 Type 2 diabetes mellitus with diabetic chronic kidney disease: Secondary | ICD-10-CM | POA: Diagnosis not present

## 2023-10-01 DIAGNOSIS — N1831 Chronic kidney disease, stage 3a: Secondary | ICD-10-CM | POA: Diagnosis not present

## 2023-10-08 DIAGNOSIS — G8929 Other chronic pain: Secondary | ICD-10-CM | POA: Diagnosis not present

## 2023-10-08 DIAGNOSIS — Z720 Tobacco use: Secondary | ICD-10-CM | POA: Diagnosis not present

## 2023-10-08 DIAGNOSIS — M810 Age-related osteoporosis without current pathological fracture: Secondary | ICD-10-CM | POA: Diagnosis not present

## 2023-10-08 DIAGNOSIS — M5442 Lumbago with sciatica, left side: Secondary | ICD-10-CM | POA: Diagnosis not present

## 2023-10-08 DIAGNOSIS — E559 Vitamin D deficiency, unspecified: Secondary | ICD-10-CM | POA: Diagnosis not present

## 2023-10-08 DIAGNOSIS — M5416 Radiculopathy, lumbar region: Secondary | ICD-10-CM | POA: Diagnosis not present

## 2023-10-15 DIAGNOSIS — M810 Age-related osteoporosis without current pathological fracture: Secondary | ICD-10-CM | POA: Diagnosis not present

## 2024-02-09 ENCOUNTER — Other Ambulatory Visit: Payer: Self-pay | Admitting: Nurse Practitioner

## 2024-02-09 DIAGNOSIS — Z1231 Encounter for screening mammogram for malignant neoplasm of breast: Secondary | ICD-10-CM

## 2024-02-25 DIAGNOSIS — N1831 Chronic kidney disease, stage 3a: Secondary | ICD-10-CM | POA: Diagnosis not present

## 2024-02-25 DIAGNOSIS — M5416 Radiculopathy, lumbar region: Secondary | ICD-10-CM | POA: Diagnosis not present

## 2024-02-25 DIAGNOSIS — E1122 Type 2 diabetes mellitus with diabetic chronic kidney disease: Secondary | ICD-10-CM | POA: Diagnosis not present

## 2024-03-10 DIAGNOSIS — F32A Depression, unspecified: Secondary | ICD-10-CM | POA: Diagnosis not present

## 2024-03-10 DIAGNOSIS — E1122 Type 2 diabetes mellitus with diabetic chronic kidney disease: Secondary | ICD-10-CM | POA: Diagnosis not present

## 2024-03-10 DIAGNOSIS — N1831 Chronic kidney disease, stage 3a: Secondary | ICD-10-CM | POA: Diagnosis not present

## 2024-03-10 DIAGNOSIS — F17210 Nicotine dependence, cigarettes, uncomplicated: Secondary | ICD-10-CM | POA: Diagnosis not present

## 2024-03-10 DIAGNOSIS — Z79899 Other long term (current) drug therapy: Secondary | ICD-10-CM | POA: Diagnosis not present

## 2024-03-10 DIAGNOSIS — M546 Pain in thoracic spine: Secondary | ICD-10-CM | POA: Diagnosis not present

## 2024-03-10 DIAGNOSIS — I129 Hypertensive chronic kidney disease with stage 1 through stage 4 chronic kidney disease, or unspecified chronic kidney disease: Secondary | ICD-10-CM | POA: Diagnosis not present

## 2024-03-10 DIAGNOSIS — M810 Age-related osteoporosis without current pathological fracture: Secondary | ICD-10-CM | POA: Diagnosis not present

## 2024-03-10 DIAGNOSIS — E782 Mixed hyperlipidemia: Secondary | ICD-10-CM | POA: Diagnosis not present

## 2024-03-13 DIAGNOSIS — G8929 Other chronic pain: Secondary | ICD-10-CM | POA: Diagnosis not present

## 2024-03-13 DIAGNOSIS — M5416 Radiculopathy, lumbar region: Secondary | ICD-10-CM | POA: Diagnosis not present

## 2024-03-13 DIAGNOSIS — M5442 Lumbago with sciatica, left side: Secondary | ICD-10-CM | POA: Diagnosis not present

## 2024-04-03 ENCOUNTER — Ambulatory Visit
Admission: RE | Admit: 2024-04-03 | Discharge: 2024-04-03 | Disposition: A | Discharge status: Home / Self Care | Source: Ambulatory Visit | Attending: Nurse Practitioner | Admitting: Nurse Practitioner

## 2024-04-03 DIAGNOSIS — Z1231 Encounter for screening mammogram for malignant neoplasm of breast: Secondary | ICD-10-CM | POA: Insufficient documentation

## 2024-05-05 DIAGNOSIS — M810 Age-related osteoporosis without current pathological fracture: Secondary | ICD-10-CM | POA: Diagnosis not present

## 2024-05-10 DIAGNOSIS — M3501 Sicca syndrome with keratoconjunctivitis: Secondary | ICD-10-CM | POA: Diagnosis not present

## 2024-05-10 DIAGNOSIS — Z0100 Encounter for examination of eyes and vision without abnormal findings: Secondary | ICD-10-CM | POA: Diagnosis not present

## 2024-05-10 DIAGNOSIS — H35379 Puckering of macula, unspecified eye: Secondary | ICD-10-CM | POA: Diagnosis not present

## 2024-05-10 DIAGNOSIS — H2512 Age-related nuclear cataract, left eye: Secondary | ICD-10-CM | POA: Diagnosis not present

## 2024-05-19 DIAGNOSIS — Z0130 Encounter for examination of blood pressure without abnormal findings: Secondary | ICD-10-CM | POA: Diagnosis not present

## 2024-06-12 DIAGNOSIS — Z79899 Other long term (current) drug therapy: Secondary | ICD-10-CM | POA: Diagnosis not present

## 2024-06-12 DIAGNOSIS — F17210 Nicotine dependence, cigarettes, uncomplicated: Secondary | ICD-10-CM | POA: Diagnosis not present

## 2024-06-12 DIAGNOSIS — M79661 Pain in right lower leg: Secondary | ICD-10-CM | POA: Diagnosis not present

## 2024-06-12 DIAGNOSIS — I8393 Asymptomatic varicose veins of bilateral lower extremities: Secondary | ICD-10-CM | POA: Diagnosis not present

## 2024-06-12 DIAGNOSIS — I129 Hypertensive chronic kidney disease with stage 1 through stage 4 chronic kidney disease, or unspecified chronic kidney disease: Secondary | ICD-10-CM | POA: Diagnosis not present

## 2024-06-12 DIAGNOSIS — N1831 Chronic kidney disease, stage 3a: Secondary | ICD-10-CM | POA: Diagnosis not present

## 2024-06-12 DIAGNOSIS — E1122 Type 2 diabetes mellitus with diabetic chronic kidney disease: Secondary | ICD-10-CM | POA: Diagnosis not present

## 2024-07-17 DIAGNOSIS — M5441 Lumbago with sciatica, right side: Secondary | ICD-10-CM | POA: Diagnosis not present

## 2024-07-17 DIAGNOSIS — G8929 Other chronic pain: Secondary | ICD-10-CM | POA: Diagnosis not present

## 2024-07-17 DIAGNOSIS — M5416 Radiculopathy, lumbar region: Secondary | ICD-10-CM | POA: Diagnosis not present

## 2024-07-17 DIAGNOSIS — M5442 Lumbago with sciatica, left side: Secondary | ICD-10-CM | POA: Diagnosis not present

## 2024-07-18 ENCOUNTER — Other Ambulatory Visit (INDEPENDENT_AMBULATORY_CARE_PROVIDER_SITE_OTHER): Payer: Self-pay | Admitting: Nurse Practitioner

## 2024-07-18 DIAGNOSIS — I83813 Varicose veins of bilateral lower extremities with pain: Secondary | ICD-10-CM

## 2024-07-18 DIAGNOSIS — M79661 Pain in right lower leg: Secondary | ICD-10-CM

## 2024-07-21 ENCOUNTER — Encounter (INDEPENDENT_AMBULATORY_CARE_PROVIDER_SITE_OTHER): Payer: Self-pay | Admitting: Nurse Practitioner

## 2024-07-21 ENCOUNTER — Ambulatory Visit (INDEPENDENT_AMBULATORY_CARE_PROVIDER_SITE_OTHER): Payer: Self-pay | Admitting: Nurse Practitioner

## 2024-07-21 ENCOUNTER — Ambulatory Visit (INDEPENDENT_AMBULATORY_CARE_PROVIDER_SITE_OTHER): Payer: Self-pay

## 2024-07-21 VITALS — BP 122/73 | HR 65 | Ht 64.0 in | Wt 156.0 lb

## 2024-07-21 DIAGNOSIS — M79605 Pain in left leg: Secondary | ICD-10-CM | POA: Diagnosis not present

## 2024-07-21 DIAGNOSIS — E785 Hyperlipidemia, unspecified: Secondary | ICD-10-CM | POA: Diagnosis not present

## 2024-07-21 DIAGNOSIS — M419 Scoliosis, unspecified: Secondary | ICD-10-CM | POA: Diagnosis not present

## 2024-07-21 DIAGNOSIS — M79604 Pain in right leg: Secondary | ICD-10-CM | POA: Diagnosis not present

## 2024-07-21 DIAGNOSIS — M79661 Pain in right lower leg: Secondary | ICD-10-CM

## 2024-07-21 DIAGNOSIS — I83813 Varicose veins of bilateral lower extremities with pain: Secondary | ICD-10-CM

## 2024-07-21 DIAGNOSIS — E119 Type 2 diabetes mellitus without complications: Secondary | ICD-10-CM | POA: Diagnosis not present

## 2024-07-23 ENCOUNTER — Encounter (INDEPENDENT_AMBULATORY_CARE_PROVIDER_SITE_OTHER): Payer: Self-pay | Admitting: Nurse Practitioner

## 2024-07-23 DIAGNOSIS — E785 Hyperlipidemia, unspecified: Secondary | ICD-10-CM | POA: Insufficient documentation

## 2024-07-23 DIAGNOSIS — M419 Scoliosis, unspecified: Secondary | ICD-10-CM | POA: Insufficient documentation

## 2024-07-23 NOTE — Progress Notes (Signed)
 Subjective:    Patient ID: Kim Reynolds, female    DOB: Jun 15, 1956, 68 y.o.   MRN: [id number] Chief Complaint  Patient presents with   New Patient (Initial Visit)    p. BLE reflux + consult. Varicose veins of both lower extremities with pain + essential hypertension + Type 2 diabetes mellitus with stage 3a chronic kidney disease, without long-term current use of insulin. gauger, sarah.     The patient is a 68 year old female who presents today with claudication-like symptoms in her lower extremities.  She notes that the pain began in her right leg in March and now she has begun to have left leg pain.  She notes that the pain tends to be somewhat constant and worse at night but it does tend to get worse when she is trying to walk.  She does have a history of significant back pain as well.  She denies any open wounds or ulcerations.  She denies any significant edema.  Originally she was sent for concern for bilateral varicose veins but she denies any pain or tenderness of the varicose veins just pain in her legs in general.  Today her noninvasive studies show no evidence of DVT or superficial thrombophlebitis bilaterally.  No evidence of deep venous insufficiency bilaterally.  No evidence of superficial venous reflux noted bilaterally.    Review of Systems  Cardiovascular:  Negative for leg swelling.  Musculoskeletal:  Positive for back pain and gait problem.  All other systems reviewed and are negative.      Objective:   Physical Exam Vitals reviewed.  HENT:     Head: Normocephalic.  Cardiovascular:     Rate and Rhythm: Normal rate.     Pulses:          Dorsalis pedis pulses are 0 on the right side and 1+ on the left side.       Posterior tibial pulses are 0 on the right side and 1+ on the left side.  Pulmonary:     Effort: Pulmonary effort is normal.  Skin:    General: Skin is warm and dry.  Neurological:     Mental Status: She is alert and oriented to person, place, and  time.     Gait: Gait abnormal.  Psychiatric:        Mood and Affect: Mood normal.        Behavior: Behavior normal.        Thought Content: Thought content normal.        Judgment: Judgment normal.     BP 122/73   Pulse 65   Ht 5' 4 (1.626 m)   Wt 156 lb (70.8 kg)   BMI 26.78 kg/m   Past Medical History:  Diagnosis Date   Allergies    Anxiety    Depression    Hx of hysterectomy    Hypercholesterolemia    Hypertension    Osteoporosis    Renal insufficiency     Social History   Socioeconomic History   Marital status: Married    Spouse name: Not on file   Number of children: Not on file   Years of education: Not on file   Highest education level: Not on file  Occupational History   Not on file  Tobacco Use   Smoking status: Every Day   Smokeless tobacco: Never  Vaping Use   Vaping status: Never Used  Substance and Sexual Activity   Alcohol use: Not Currently   Drug use: Not  Currently   Sexual activity: Not on file  Other Topics Concern   Not on file  Social History Narrative   Not on file   Social Drivers of Health   Financial Resource Strain: Low Risk  (09/03/2023)   [facility]   Overall Financial Resource Strain (CARDIA)    Difficulty of Paying Living Expenses: Not hard at all  Food Insecurity: No Food Insecurity (09/03/2023)   Received from [facility]   Hunger Vital Sign    Within the past 12 months, you worried that your food would run out before you got the money to buy more.: Never true    Within the past 12 months, the food you bought just didn't last and you didn't have money to get more.: Never true  Transportation Needs: No Transportation Needs (09/03/2023)   Received from [facility] - Transportation    In the past 12 months, has lack of transportation kept you from medical appointments or from getting medications?: No    Lack of Transportation (Non-Medical): No   Physical Activity: Not on file  Stress: Not on file  Social Connections: Not on file  Intimate Partner Violence: Not on file    Past Surgical History:  Procedure Laterality Date   ABDOMINAL HYSTERECTOMY     BREAST BIOPSY Right 2003   neg   CATARACT EXTRACTION W/PHACO Right 09/17/2020   Procedure: CATARACT EXTRACTION PHACO AND INTRAOCULAR LENS PLACEMENT (IOC) RIGHT 6.40 00:42.9;  Surgeon: Jaye Fallow, MD;  Location: [facility];  Service: Ophthalmology;  Laterality: Right;   DILATION AND CURETTAGE OF UTERUS      History reviewed. No pertinent family history.  Allergies  Allergen Reactions   Codeine     Rapid heart breat; break out in a sweat        No data to display            CMP  No results found for: NA, K, CL, CO2, GLUCOSE, BUN, CREATININE, CALCIUM, PROT, ALBUMIN, AST, ALT, ALKPHOS, BILITOT, GFR, EGFR, GFRNONAA   No results found.     Assessment & Plan:   1. Pain in both lower extremities (Primary) Based upon the patient's pain in her lower extremities, I am suspicious she may be suffering from claudication symptoms.  She has risk factors such as smoking, diabetes, and hyperlipidemia which places her at risk for peripheral arterial disease.  Based on this we will have the patient return for noninvasive studies in order to evaluate and recommend further treatment if it is found that the studies are abnormal.  I have also discussed with the patient that it is certainly possible that her source of her lower extremity pain is related to her back as noted below.  Today her venous reflux studies were normal and so I do not believe that her source of pain or discomfort is related to varicosities.  Currently her varicosities are superficial in nature.  2. Type 2 diabetes mellitus without complication, without long-term current use of insulin (HCC) Continue hypoglycemic medications as already ordered, these medications have  been reviewed and there are no changes at this time.  Hgb A1C to be monitored as already arranged by primary service  3. Hyperlipidemia, unspecified hyperlipidemia type Continue statin as ordered and reviewed, no changes at this time  4. Scoliosis, unspecified scoliosis type, unspecified spinal region She does have known scoliosis and significant back pain.  She has an upcoming MRI to  evaluate disease which I believe is prudent and sometimes lower extremity pain can result from radiculopathy.   Current Outpatient Medications on File Prior to Visit  Medication Sig Dispense Refill   escitalopram (LEXAPRO) 5 MG tablet Take 5 mg by mouth daily.     loratadine (CLARITIN) 10 MG tablet Take 10 mg by mouth daily.     rosuvastatin (CRESTOR) 5 MG tablet Take 5 mg by mouth 3 (three) times a week.     No current facility-administered medications on file prior to visit.    There are no Patient Instructions on file for this visit. No follow-ups on file.   Lylith Bebeau E Derek Huneycutt, NP

## 2024-07-27 ENCOUNTER — Other Ambulatory Visit: Payer: Self-pay | Admitting: Physical Medicine & Rehabilitation

## 2024-07-27 DIAGNOSIS — M5416 Radiculopathy, lumbar region: Secondary | ICD-10-CM

## 2024-07-29 ENCOUNTER — Ambulatory Visit
Admission: RE | Admit: 2024-07-29 | Discharge: 2024-07-29 | Disposition: A | Discharge status: Home / Self Care | Source: Ambulatory Visit | Attending: Physical Medicine & Rehabilitation | Admitting: Physical Medicine & Rehabilitation

## 2024-07-29 DIAGNOSIS — M47816 Spondylosis without myelopathy or radiculopathy, lumbar region: Secondary | ICD-10-CM | POA: Diagnosis not present

## 2024-07-29 DIAGNOSIS — M5126 Other intervertebral disc displacement, lumbar region: Secondary | ICD-10-CM | POA: Diagnosis not present

## 2024-07-29 DIAGNOSIS — M5416 Radiculopathy, lumbar region: Secondary | ICD-10-CM

## 2024-08-11 ENCOUNTER — Other Ambulatory Visit: Payer: Self-pay | Admitting: Family Medicine

## 2024-08-11 ENCOUNTER — Inpatient Hospital Stay
Admission: RE | Admit: 2024-08-11 | Discharge: 2024-08-11 | Disposition: A | Discharge status: Home / Self Care | Payer: Self-pay | Source: Ambulatory Visit | Attending: Orthopedic Surgery | Admitting: Orthopedic Surgery

## 2024-08-11 DIAGNOSIS — N183 Chronic kidney disease, stage 3 unspecified: Secondary | ICD-10-CM | POA: Insufficient documentation

## 2024-08-11 DIAGNOSIS — J309 Allergic rhinitis, unspecified: Secondary | ICD-10-CM | POA: Insufficient documentation

## 2024-08-11 DIAGNOSIS — F32A Depression, unspecified: Secondary | ICD-10-CM | POA: Insufficient documentation

## 2024-08-11 DIAGNOSIS — E559 Vitamin D deficiency, unspecified: Secondary | ICD-10-CM | POA: Insufficient documentation

## 2024-08-11 DIAGNOSIS — Z049 Encounter for examination and observation for unspecified reason: Secondary | ICD-10-CM

## 2024-08-11 DIAGNOSIS — K589 Irritable bowel syndrome without diarrhea: Secondary | ICD-10-CM | POA: Insufficient documentation

## 2024-08-11 NOTE — Progress Notes (Addendum)
 Referring Physician:  Dodson Delon FERNS, MD [address] [place],  [place] [address]  Primary Physician:  Kim Lauraine Collar, NP  History of Present Illness: 08/15/2024 Kim Reynolds has a history of CKD stage 3, depression, DM, hyperlipidemia, IBS, osteoporosis.   She has diffuse neck and back pain x years.   She has constant neck pain with no arm pain. She has constant pain into mid and lower back with constant posterior left leg pain mostly to her knee, but can go into calf. No right leg pain. No radiation of pain into the ribs. Primary complaint is her lower back and left leg pain.  Pain is worse with standing, bending, and carrying things. Pain is better with ice and elevating her legs. No numbness, tingling, or weakness on her arms or legs.   She is on neurontin.   She is seeing vascular for her calf pain and they are working it up.   Dr. Dodson put in PT orders on 07/17/24- she has not started it yet.   Tobacco use: smokes 1/2 PPD x 50+ years.   Bowel/Bladder Dysfunction: none  Conservative measures:  Physical therapy:  has participated in 3 visits of PT at [facility] (July 2024 - August 2024). Sent to PT at  [facility] in [facility] by Dr. Dodson- has not started yet.  Multimodal medical therapy including regular antiinflammatories: cymbalta, gabapentin Injections:  02/25/2024: left L4-5 and L5-S1 TF ESI (no relief) 09/24/23: Left L4-5 and L5-S1 TF ESI (85% relief)  Past Surgery: no spinal surgeries  Kim Reynolds has no symptoms of cervical myelopathy.  The symptoms are causing a significant impact on the patient's life.   Review of Systems:  A 10 point review of systems is negative, except for the pertinent positives and negatives detailed in the HPI.  Past Medical History: Past Medical History:  Diagnosis Date   Allergies    Anxiety    Depression    Hx of hysterectomy    Hypercholesterolemia    Hypertension    Osteoporosis    Renal  insufficiency     Past Surgical History: Past Surgical History:  Procedure Laterality Date   ABDOMINAL HYSTERECTOMY     BREAST BIOPSY Right 2003   neg   CATARACT EXTRACTION W/PHACO Right 09/17/2020   Procedure: CATARACT EXTRACTION PHACO AND INTRAOCULAR LENS PLACEMENT (IOC) RIGHT 6.40 00:42.9;  Surgeon: Jaye Fallow, MD;  Location: [facility];  Service: Ophthalmology;  Laterality: Right;   DILATION AND CURETTAGE OF UTERUS      Allergies: Allergies as of 08/15/2024 - Review Complete 08/15/2024  Allergen Reaction Noted   Alendronate Other (See Comments) 10/14/2020   Cholecalciferol Nausea Only 07/26/2014   Codeine  09/10/2020    Medications: Outpatient Encounter Medications as of 08/15/2024  Medication Sig   escitalopram (LEXAPRO) 5 MG tablet Take 5 mg by mouth daily.   gabapentin (NEURONTIN) 100 MG capsule Take 100 mg by mouth 2 (two) times daily.   loratadine (CLARITIN) 10 MG tablet Take 10 mg by mouth daily.   losartan-hydrochlorothiazide (HYZAAR) 50-12.5 MG tablet Take 1 tablet by mouth daily.   metFORMIN (GLUCOPHAGE-XR) 500 MG 24 hr tablet Take 500 mg by mouth daily with breakfast.   omeprazole (PRILOSEC) 40 MG capsule Take 40 mg by mouth daily.   PROLIA  60 MG/ML SOSY injection Inject 60 mg into the skin.   rosuvastatin (CRESTOR) 5 MG tablet Take 5 mg by mouth 3 (three) times a week.   [DISCONTINUED] DULoxetine (CYMBALTA) 30  MG capsule Take 30 mg by mouth daily.   [DISCONTINUED] hydrochlorothiazide (HYDRODIURIL) 25 MG tablet Take 12.5 mg by mouth daily.   [DISCONTINUED] JARDIANCE 25 MG TABS tablet Take 25 mg by mouth daily.   No facility-administered encounter medications on file as of 08/15/2024.    Social History: Social History   Tobacco Use   Smoking status: Every Day   Smokeless tobacco: Never  Vaping Use   Vaping status: Never Used  Substance Use Topics   Alcohol use: Not Currently   Drug use: Not Currently    Family Medical History: No family  history on file.  Physical Examination: Vitals:   08/15/24 0853  BP: 138/78    General: Patient is well developed, well nourished, calm, collected, and in no apparent distress. Attention to examination is appropriate.  Respiratory: Patient is breathing without any difficulty.   NEUROLOGICAL:     Awake, alert, oriented to person, place, and time.  Speech is clear and fluent. Fund of knowledge is appropriate.   Right shoulder is elevated with right thoracic rib hump.   Cranial Nerves: Pupils equal round and reactive to light.  Facial tone is symmetric.    Left lower posterior lumbar tenderness.   No abnormal lesions on exposed skin.   Strength: Side Biceps Triceps Deltoid Interossei Grip Wrist Ext. Wrist Flex.  R 5 5 5 5 5 5 5   L 5 5 5 5 5 5 5    Side Iliopsoas Quads Hamstring PF DF EHL  R 5 5 5 5 5 5   L 5 5 5 5 5 5    Reflexes are 2+ and symmetric at the biceps, brachioradialis, patella and achilles.   Hoffman's is absent.  Clonus is not present.   Bilateral upper and lower extremity sensation is intact to light touch.     No pain with IR/ER of both hips.   Gait is normal.     Medical Decision Making  Imaging: Lumbar MRI dated 07/29/24:  FINDINGS: Segmentation: Standard. Lowest well-formed disc space labeled the L5-S1 level.   Alignment: Moderate to severe levoscoliosis, apex at L2-3, relatively similar to prior. Trace degenerative retrolisthesis of L1 on L2 and L2 on L3, with trace anterolisthesis of L4 on L5, also stable.   Vertebrae: Vertebral body height maintained without acute or chronic fracture. Bone marrow signal intensity within normal limits. No worrisome osseous lesions. No abnormal marrow edema.   Conus medullaris and cauda equina: Conus extends to the T12-L1 level. Conus and cauda equina appear normal.   Paraspinal and other soft tissues: Paraspinous soft tissues demonstrate no acute finding. Asymmetric left renal atrophy noted. Multiple T2  hyperintense right renal cyst, benign in appearance, no follow-up imaging recommended.   Disc levels:   T12-L1: Disc desiccation with diffuse disc bulge. Mild reactive endplate spurring. No spinal stenosis. Foramina remain patent.   L1-2: Diffuse disc bulge with mild endplate spurring. Mild right greater than left facet hypertrophy. Associated trace joint effusions. Mild narrowing of the right lateral recess. Central canal remains patent. No foraminal stenosis. Appearance is stable.   L2-3: Diffuse disc bulge with disc desiccation. Mild reactive endplate spurring. Mild right greater left facet hypertrophy with associated trace joint effusions. Prominence of the dorsal epidural fat. Borderline mild narrowing of the right lateral recess. Central canal remains patent. No significant foraminal stenosis.   L3-4: Diffuse disc bulge with disc desiccation. Superimposed right subarticular to foraminal disc protrusion (series 114, image 24). Associated annular fissuring noted. Mild to moderate facet and ligament  flavum hypertrophy with prominence of the dorsal epidural fat. Resultant mild-to-moderate spinal stenosis, with mild bilateral L3 foraminal narrowing.   L4-5: Disc desiccation with diffuse disc bulge, asymmetric to the left. Mild reactive endplate spurring. Moderate bilateral facet and ligament flavum hypertrophy. Mild prominence of the dorsal epidural fat. Resultant mild canal with moderate left lateral recess stenosis, with moderate left L4 foraminal narrowing. Appearance is similar.   L5-S1: Negative interspace. No spinal stenosis. Foramina remain patent.   IMPRESSION: 1. Overall, little interval change in appearance of the lumbar spine as compared to 08/24/2023. 2. Multifactorial degenerative changes at L3-4 with resultant mild to moderate spinal stenosis, with mild bilateral L3 foraminal narrowing. 3. Disc bulge with facet hypertrophy at L4-5 with resultant mild canal  and moderate left lateral recess stenosis, with moderate left L4 foraminal narrowing. 4. Moderate to severe levoscoliosis, apex at L2-3.     Electronically Signed   By: Morene Hoard M.D.   On: 07/29/2024 19:43  I have personally reviewed the images and agree with the above interpretation.  Assessment and Plan: Ms. Sumler has a history of diffuse neck and back pain x years.   She has constant neck pain with no arm pain. She has constant pain into mid and lower back with constant posterior left leg pain mostly to her knee, but can go into calf. No right leg pain. No radiation of pain into the ribs.   Primary complaint is her lower back and left leg pain. No numbness, tingling, or weakness on her arms or legs.   She has significant TL scoliosis with retrolisthesis L1-L3 and slip L4-L5. She has mild/moderate central stenosis L3-L4 with mild bilateral foraminal stenosis, mild central stenosis L4-L5 with moderate left foraminal stenosis.   Treatment options discussed with patient and following plan made:   - Full length scoliosis xrays ordered to be done at [facility].  - No previous improvement with PT over a year ago. No improvement with injections.  - Will review with Dr. Clois once I have her scoliosis xrays to see if she is a surgery candidate and if she could have smaller surgery instead of scoliosis surgery.  - We did discuss that prior to any surgery discussion that she would need to do PT, quit smoking, and get a repeat bone density (scheduled for one in October). She has known osteoporosis and has been on prolia  since 2023.  - Will set up phone visit to discuss her options and further follow up once I review with Dr. Clois.   I spent a total of 45 minutes in face-to-face and non-face-to-face activities related to this patient's care today including review of outside records, review of imaging, review of symptoms, physical exam, discussion of differential diagnosis,  discussion of treatment options, and documentation.   Thank you for involving me in the care of this patient.   ADDENDUM 08/29/24:  Patient reviewed with Dr. Clois including her scoliosis xrays. Surgery to correct deformity would be T2-pelvis. It is possible to consider a more limited approach.   She would still need to do PT, quit smoking, and get a repeat bone density (scheduled for one in October) prior to any surgery discussion. She has known osteoporosis and has been on prolia  since 2023.   Will discuss during her phone visit to review her scoliosis xrays.   Glade Boys PA-C Dept. of Neurosurgery

## 2024-08-15 ENCOUNTER — Ambulatory Visit: Admitting: Orthopedic Surgery

## 2024-08-15 ENCOUNTER — Encounter: Payer: Self-pay | Admitting: Orthopedic Surgery

## 2024-08-15 VITALS — BP 138/78 | Ht 64.0 in | Wt 158.0 lb

## 2024-08-15 DIAGNOSIS — M48062 Spinal stenosis, lumbar region with neurogenic claudication: Secondary | ICD-10-CM

## 2024-08-15 DIAGNOSIS — M47816 Spondylosis without myelopathy or radiculopathy, lumbar region: Secondary | ICD-10-CM

## 2024-08-15 DIAGNOSIS — M5416 Radiculopathy, lumbar region: Secondary | ICD-10-CM

## 2024-08-15 DIAGNOSIS — M4120 Other idiopathic scoliosis, site unspecified: Secondary | ICD-10-CM | POA: Diagnosis not present

## 2024-08-15 DIAGNOSIS — M4726 Other spondylosis with radiculopathy, lumbar region: Secondary | ICD-10-CM

## 2024-08-15 DIAGNOSIS — M542 Cervicalgia: Secondary | ICD-10-CM | POA: Diagnosis not present

## 2024-08-15 NOTE — Patient Instructions (Signed)
 It was so nice to see you today. Thank you so much for coming in.    You have significant scoliosis in your mid and lower back. I want to get scoliosis xrays at [facility]. You do not need an appointment.   You also have some spinal stenosis (pressure on spinal cord) and irritation of nerves on right and left at L3-L4 and L4-L5.   Once I get your scoliosis xrays, I will review with Dr. Clois. We can set up a phone visit to discuss your options.   Prior to any back surgery, you likely would need to quit smoking, get a new bone density, and do PT.   Please do not hesitate to call if you have any questions or concerns. You can also message me in MyChart.   Glade Boys PA-C [phone]     The physicians and staff at [facility]surgery at [facility] are committed to providing excellent care. You may receive a survey asking for feedback about your experience at our office. We value you your feedback and appreciate you taking the time to to fill it out. The [facility] leadership team is also available to discuss your experience in person, feel free to contact us  [phone].

## 2024-08-18 ENCOUNTER — Ambulatory Visit
Admission: RE | Admit: 2024-08-18 | Discharge: 2024-08-18 | Disposition: A | Discharge status: Home / Self Care | Attending: Orthopedic Surgery | Admitting: Orthopedic Surgery

## 2024-08-18 ENCOUNTER — Ambulatory Visit
Admission: RE | Admit: 2024-08-18 | Discharge: 2024-08-18 | Disposition: A | Discharge status: Home / Self Care | Source: Ambulatory Visit | Attending: Orthopedic Surgery | Admitting: Orthopedic Surgery

## 2024-08-18 DIAGNOSIS — M51369 Other intervertebral disc degeneration, lumbar region without mention of lumbar back pain or lower extremity pain: Secondary | ICD-10-CM | POA: Diagnosis not present

## 2024-08-18 DIAGNOSIS — M419 Scoliosis, unspecified: Secondary | ICD-10-CM | POA: Diagnosis not present

## 2024-08-18 DIAGNOSIS — M4120 Other idiopathic scoliosis, site unspecified: Secondary | ICD-10-CM | POA: Insufficient documentation

## 2024-08-24 ENCOUNTER — Other Ambulatory Visit (INDEPENDENT_AMBULATORY_CARE_PROVIDER_SITE_OTHER): Payer: Self-pay | Admitting: Nurse Practitioner

## 2024-08-24 DIAGNOSIS — M79604 Pain in right leg: Secondary | ICD-10-CM

## 2024-08-25 ENCOUNTER — Encounter (INDEPENDENT_AMBULATORY_CARE_PROVIDER_SITE_OTHER): Payer: Self-pay | Admitting: Nurse Practitioner

## 2024-08-25 ENCOUNTER — Ambulatory Visit (INDEPENDENT_AMBULATORY_CARE_PROVIDER_SITE_OTHER)

## 2024-08-25 ENCOUNTER — Ambulatory Visit (INDEPENDENT_AMBULATORY_CARE_PROVIDER_SITE_OTHER): Admitting: Nurse Practitioner

## 2024-08-25 ENCOUNTER — Other Ambulatory Visit (INDEPENDENT_AMBULATORY_CARE_PROVIDER_SITE_OTHER)

## 2024-08-25 VITALS — BP 117/73 | HR 65 | Ht 64.0 in | Wt 157.5 lb

## 2024-08-25 DIAGNOSIS — I70213 Atherosclerosis of native arteries of extremities with intermittent claudication, bilateral legs: Secondary | ICD-10-CM

## 2024-08-25 DIAGNOSIS — E785 Hyperlipidemia, unspecified: Secondary | ICD-10-CM

## 2024-08-25 DIAGNOSIS — M79604 Pain in right leg: Secondary | ICD-10-CM

## 2024-08-25 DIAGNOSIS — E119 Type 2 diabetes mellitus without complications: Secondary | ICD-10-CM | POA: Diagnosis not present

## 2024-08-25 DIAGNOSIS — G8929 Other chronic pain: Secondary | ICD-10-CM | POA: Diagnosis not present

## 2024-08-25 DIAGNOSIS — M5442 Lumbago with sciatica, left side: Secondary | ICD-10-CM | POA: Diagnosis not present

## 2024-08-25 DIAGNOSIS — M79605 Pain in left leg: Secondary | ICD-10-CM

## 2024-08-27 ENCOUNTER — Encounter (INDEPENDENT_AMBULATORY_CARE_PROVIDER_SITE_OTHER): Payer: Self-pay | Admitting: Nurse Practitioner

## 2024-08-27 NOTE — Progress Notes (Signed)
 Subjective:    Patient ID: Kim Reynolds, female    DOB: Aug 22, 1956, 68 y.o.   MRN: [id number] Chief Complaint  Patient presents with   Follow-up    pt conv + ABI + Bilat Art Dupex    The patient is a 68 year old female who presents today with claudication-like symptoms in her lower extremities.  She notes that the pain began in her right leg in March and now she has begun to have left leg pain.  She notes that the pain tends to be somewhat constant and worse at night but it does tend to get worse when she is trying to walk.  She does have a history of significant back pain as well.  She denies any open wounds or ulcerations.  She denies any significant edema.  Originally she was sent for concern for bilateral varicose veins but she denies any pain or tenderness of the varicose veins just pain in her legs in general.  Previously, her noninvasive studies show no evidence of DVT or superficial thrombophlebitis bilaterally.  No evidence of deep venous insufficiency bilaterally.  No evidence of superficial venous reflux noted bilaterally.  She is a current smoker.  She also just underwent recent evaluation with neurosurgery for ongoing lower back issues.  Today she has a 0.57 on the right and 0.85 on the left.  She also underwent bilateral arterial duplexes which shows 50 to 74% stenosis in the distal external iliac artery as well as in the distal SFA.  She has monophasic waveforms primarily throughout the leg after the distal external iliac artery and the right lower extremity.  The left lower extremity appears to have more tibial outflow stenosis.      Review of Systems  Cardiovascular:        Claudication  Musculoskeletal:  Positive for back pain.  All other systems reviewed and are negative.      Objective:   Physical Exam Vitals reviewed.  HENT:     Head: Normocephalic.  Cardiovascular:     Rate and Rhythm: Normal rate.     Pulses:          Dorsalis pedis pulses are detected  w/ Doppler on the right side and detected w/ Doppler on the left side.       Posterior tibial pulses are detected w/ Doppler on the right side and detected w/ Doppler on the left side.  Pulmonary:     Effort: Pulmonary effort is normal.  Skin:    General: Skin is warm and dry.  Neurological:     Mental Status: She is alert and oriented to person, place, and time.  Psychiatric:        Mood and Affect: Mood normal.        Behavior: Behavior normal.        Thought Content: Thought content normal.        Judgment: Judgment normal.     BP 117/73   Pulse 65   Ht 5' 4 (1.626 m)   Wt 157 lb 8 oz (71.4 kg)   BMI 27.03 kg/m   Past Medical History:  Diagnosis Date   Allergies    Anxiety    Depression    Hx of hysterectomy    Hypercholesterolemia    Hypertension    Osteoporosis    Renal insufficiency     Social History   Socioeconomic History   Marital status: Married    Spouse name: Not on file   Number of children:  Not on file   Years of education: Not on file   Highest education level: Not on file  Occupational History   Not on file  Tobacco Use   Smoking status: Every Day   Smokeless tobacco: Never  Vaping Use   Vaping status: Never Used  Substance and Sexual Activity   Alcohol use: Not Currently   Drug use: Not Currently   Sexual activity: Not on file  Other Topics Concern   Not on file  Social History Narrative   Not on file   Social Drivers of Health   Financial Resource Strain: Low Risk  (09/03/2023)   Received from [facility]   Overall Financial Resource Strain (CARDIA)    Difficulty of Paying Living Expenses: Not hard at all  Food Insecurity: No Food Insecurity (09/03/2023)   Received from [facility]   Hunger Vital Sign    Within the past 12 months, you worried that your food would run out before you got the money to buy more.: Never true    Within the past 12 months, the food you bought just didn't last and you  didn't have money to get more.: Never true  Transportation Needs: No Transportation Needs (09/03/2023)   Received from [facility]    In the past 12 months, has lack of transportation kept you from medical appointments or from getting medications?: No    Lack of Transportation (Non-Medical): No  Physical Activity: Not on file  Stress: Not on file  Social Connections: Not on file  Intimate Partner Violence: Not on file    Past Surgical History:  Procedure Laterality Date   ABDOMINAL HYSTERECTOMY     BREAST BIOPSY Right 2003   neg   CATARACT EXTRACTION W/PHACO Right 09/17/2020   Procedure: CATARACT EXTRACTION PHACO AND INTRAOCULAR LENS PLACEMENT (IOC) RIGHT 6.40 00:42.9;  Surgeon: Jaye Fallow, MD;  Location: [facility];  Service: Ophthalmology;  Laterality: Right;   DILATION AND CURETTAGE OF UTERUS      History reviewed. No pertinent family history.  Allergies  Allergen Reactions   Alendronate Other (See Comments)    Severe Left hip pain   Cholecalciferol Nausea Only   Codeine     Rapid heart breat; break out in a sweat        No data to display            CMP  No results found for: NA, K, CL, CO2, GLUCOSE, BUN, CREATININE, CALCIUM, PROT, ALBUMIN, AST, ALT, ALKPHOS, BILITOT, GFR, EGFR, GFRNONAA   No results found.     Assessment & Plan:   1. Atherosclerosis of native artery of both lower extremities with intermittent claudication (Primary) I had a long discussion with the patient regarding peripheral arterial disease.  Following this discussion we discussed options for treatment including an angiogram which may be a prelude to surgery.  In addition we discussed conservative therapy including use of exercise and activity to try to decrease claudication-like symptoms.  Today she actually denies any rest pain like symptoms.  She is on appropriate conservative medical management  except for daily baby aspirin which we have recommended that she begin.  She is advised to follow-up in 3 months with noninvasive studies or sooner if her symptoms worsen.  2. Type 2 diabetes mellitus without complication, without long-term current use of insulin (HCC) Continue hypoglycemic medications as already ordered, these medications have been reviewed and there are no changes  at this time.  Hgb A1C to be monitored as already arranged by primary service  3. Hyperlipidemia, unspecified hyperlipidemia type Continue statin as ordered and reviewed, no changes at this time  4. Chronic left-sided low back pain with left-sided sciatica 1 the patient notes that her back pain is the largest issue that is bothering her at this time.  This is currently being worked up by neurosurgery.  We discussed that if she were to require an angiogram any intervention on her back would have to wait for least 3 months.  Based on this she wishes to finish workup for undergoing a vein and veins which at this point is reasonable based on her symptoms and presentation.   Current Outpatient Medications on File Prior to Visit  Medication Sig Dispense Refill   escitalopram (LEXAPRO) 5 MG tablet Take 5 mg by mouth daily.     gabapentin (NEURONTIN) 100 MG capsule Take 100 mg by mouth 2 (two) times daily.     loratadine (CLARITIN) 10 MG tablet Take 10 mg by mouth daily.     losartan-hydrochlorothiazide (HYZAAR) 50-12.5 MG tablet Take 1 tablet by mouth daily.     metFORMIN (GLUCOPHAGE-XR) 500 MG 24 hr tablet Take 500 mg by mouth daily with breakfast.     omeprazole (PRILOSEC) 40 MG capsule Take 40 mg by mouth daily.     PROLIA  60 MG/ML SOSY injection Inject 60 mg into the skin.     rosuvastatin (CRESTOR) 5 MG tablet Take 5 mg by mouth 3 (three) times a week.     No current facility-administered medications on file prior to visit.    There are no Patient Instructions on file for this visit. No follow-ups on  file.   Rochester Serpe E Derian Pfost, NP

## 2024-08-28 LAB — VAS US ABI WITH/WO TBI
Left ABI: 0.85
Right ABI: 0.57

## 2024-08-30 NOTE — Progress Notes (Unsigned)
 Telephone Visit- Progress Note: Referring Physician:  Don Lauraine Collar, NP [address] [place],  [place] [address]  Primary Physician:  Don Lauraine Collar, NP  This visit was performed via telephone.  Patient location: home Provider location: working from home  I spent a total of 15 minutes non-face-to-face activities for this visit on the date of this encounter including review of current clinical condition and response to treatment.    Patient has given verbal consent to this telephone visits and we reviewed the limitations of a telephone visit. Patient wishes to proceed.    Chief Complaint:  review imaging  History of Present Illness: Kim Reynolds is a 68 y.o. female has a history of  CKD stage 3, depression, DM, hyperlipidemia, IBS, osteoporosis.    She has diffuse neck and back pain x years.   Last seen by me on 08/15/24 for constant neck pain with no arm pain along with LBP and left leg pain. Back and leg pain was her primary complaint.   She has known significant TL scoliosis with retrolisthesis L1-L3 and slip L4-L5. She has mild/moderate central stenosis L3-L4 with mild bilateral foraminal stenosis, mild central stenosis L4-L5 with moderate left foraminal stenosis.   Phone visit scheduled to review her scoliosis xrays.   She is about the same. She has constant pain into mid and lower back with constant posterior left leg pain mostly to her knee, but can go into calf. No right leg pain. No radiation of pain into the ribs. Primary complaint is her lower back and left leg pain.  Pain is worse with standing, bending, and carrying things. No numbness, tingling, or weakness on her arms or legs.    She is on neurontin.    She is seeing vascular for her calf pain- has follow up in 3 months for repeat ABIs.    Dr. Dodson put in PT orders on 07/17/24- she has not started it yet.    Tobacco use: smokes 1/2 PPD x 50+ years.    Bowel/Bladder Dysfunction: none    Conservative measures:  Physical therapy:  has participated in 3 visits of PT at [facility] (July 2024 - August 2024). Sent to PT at [facility] in [facility] by Dr. Dodson- has not started yet.  Multimodal medical therapy including regular antiinflammatories: cymbalta, gabapentin Injections:  02/25/2024: left L4-5 and L5-S1 TF ESI (no relief) 09/24/23: Left L4-5 and L5-S1 TF ESI (85% relief)   Past Surgery: no spinal surgeries    Exam: No exam done as this was a telephone encounter.     Imaging: Scoliosis xrays dated 08/18/24:  FINDINGS: Severe dextrolevoscoliosis of the thoracolumbar spine, dextroscoliosis of the thoracic spine apex T8, Cobb angle 53 degrees, levoscoliosis of the lumbar spine apex L3-L4, Cobb angle 47 degrees. No fracture or dislocation. Mild associated multilevel disc degenerative disease.   IMPRESSION: Severe dextrolevoscoliosis of the thoracolumbar spine, dextroscoliosis of the thoracic spine apex T8, Cobb angle 53 degrees, levoscoliosis of the lumbar spine apex L3-L4, Cobb angle 47 degrees.     Electronically Signed   By: Marolyn JONETTA Jaksch M.D.   On: 08/24/2024 09:21  I have personally reviewed the images and agree with the above interpretation.  Above imaging reviewed with Dr. Clois prior to her visit.   Assessment and Plan: [name] has a history of diffuse neck and back pain x years.    She has constant pain into mid and lower back with constant posterior left leg pain mostly to her  knee, but can go into calf. No right leg pain. No radiation of pain into the ribs.    Primary complaint is her lower back and left leg pain. No numbness, tingling, or weakness on her arms or legs.    She has significant severe TL scoliosis with mild/moderate central stenosis L3-L4 with mild bilateral foraminal stenosis, mild central stenosis L4-L5 with moderate left foraminal stenosis.   Imaging reviewed with Dr. Clois prior to her visit and we discussed possible  options. Surgery to correct her deformity would be T2-pelvis fusion. It is possible to consider a more limited approach.    Treatment options discussed with patient and following plan made:   - We did discuss that prior to any surgery discussion that she would need to do PT, quit smoking, and get a repeat bone density (scheduled for one in November). She has known osteoporosis and has been on prolia  since 2023.  - In addition, her HgbA1c was 8.4 on 06/12/24. This would need to be below 7.5 prior to any surgery.  - She will start PT at [facility] in [facility] and work on smoking/blood sugars.  - Phone visit scheduled with me in 6 weeks.   Glade Boys PA-C Neurosurgery

## 2024-09-01 ENCOUNTER — Encounter: Payer: Self-pay | Admitting: Orthopedic Surgery

## 2024-09-01 ENCOUNTER — Ambulatory Visit: Admitting: Orthopedic Surgery

## 2024-09-01 DIAGNOSIS — M4125 Other idiopathic scoliosis, thoracolumbar region: Secondary | ICD-10-CM

## 2024-09-01 DIAGNOSIS — M48062 Spinal stenosis, lumbar region with neurogenic claudication: Secondary | ICD-10-CM

## 2024-09-01 DIAGNOSIS — M4726 Other spondylosis with radiculopathy, lumbar region: Secondary | ICD-10-CM | POA: Diagnosis not present

## 2024-09-01 DIAGNOSIS — M5416 Radiculopathy, lumbar region: Secondary | ICD-10-CM

## 2024-09-01 DIAGNOSIS — M47816 Spondylosis without myelopathy or radiculopathy, lumbar region: Secondary | ICD-10-CM

## 2024-09-01 DIAGNOSIS — M4120 Other idiopathic scoliosis, site unspecified: Secondary | ICD-10-CM

## 2024-09-20 DIAGNOSIS — F32A Depression, unspecified: Secondary | ICD-10-CM | POA: Diagnosis not present

## 2024-09-20 DIAGNOSIS — E1122 Type 2 diabetes mellitus with diabetic chronic kidney disease: Secondary | ICD-10-CM | POA: Diagnosis not present

## 2024-09-20 DIAGNOSIS — I129 Hypertensive chronic kidney disease with stage 1 through stage 4 chronic kidney disease, or unspecified chronic kidney disease: Secondary | ICD-10-CM | POA: Diagnosis not present

## 2024-09-20 DIAGNOSIS — M546 Pain in thoracic spine: Secondary | ICD-10-CM | POA: Diagnosis not present

## 2024-09-20 DIAGNOSIS — Z79899 Other long term (current) drug therapy: Secondary | ICD-10-CM | POA: Diagnosis not present

## 2024-09-20 DIAGNOSIS — E782 Mixed hyperlipidemia: Secondary | ICD-10-CM | POA: Diagnosis not present

## 2024-09-20 DIAGNOSIS — Z1331 Encounter for screening for depression: Secondary | ICD-10-CM | POA: Diagnosis not present

## 2024-09-20 DIAGNOSIS — F17210 Nicotine dependence, cigarettes, uncomplicated: Secondary | ICD-10-CM | POA: Diagnosis not present

## 2024-09-20 DIAGNOSIS — M810 Age-related osteoporosis without current pathological fracture: Secondary | ICD-10-CM | POA: Diagnosis not present

## 2024-09-20 DIAGNOSIS — Z0000 Encounter for general adult medical examination without abnormal findings: Secondary | ICD-10-CM | POA: Diagnosis not present

## 2024-09-25 DIAGNOSIS — M5442 Lumbago with sciatica, left side: Secondary | ICD-10-CM | POA: Diagnosis not present

## 2024-09-25 DIAGNOSIS — M5441 Lumbago with sciatica, right side: Secondary | ICD-10-CM | POA: Diagnosis not present

## 2024-09-25 DIAGNOSIS — M6281 Muscle weakness (generalized): Secondary | ICD-10-CM | POA: Diagnosis not present

## 2024-09-25 DIAGNOSIS — G8929 Other chronic pain: Secondary | ICD-10-CM | POA: Diagnosis not present

## 2024-10-04 DIAGNOSIS — M5441 Lumbago with sciatica, right side: Secondary | ICD-10-CM | POA: Diagnosis not present

## 2024-10-04 DIAGNOSIS — G8929 Other chronic pain: Secondary | ICD-10-CM | POA: Diagnosis not present

## 2024-10-04 DIAGNOSIS — M5442 Lumbago with sciatica, left side: Secondary | ICD-10-CM | POA: Diagnosis not present

## 2024-10-04 DIAGNOSIS — M6281 Muscle weakness (generalized): Secondary | ICD-10-CM | POA: Diagnosis not present

## 2024-10-05 DIAGNOSIS — I10 Essential (primary) hypertension: Secondary | ICD-10-CM | POA: Diagnosis not present

## 2024-10-05 DIAGNOSIS — E782 Mixed hyperlipidemia: Secondary | ICD-10-CM | POA: Diagnosis not present

## 2024-10-05 DIAGNOSIS — Z79899 Other long term (current) drug therapy: Secondary | ICD-10-CM | POA: Diagnosis not present

## 2024-10-05 DIAGNOSIS — M810 Age-related osteoporosis without current pathological fracture: Secondary | ICD-10-CM | POA: Diagnosis not present

## 2024-10-05 DIAGNOSIS — E1122 Type 2 diabetes mellitus with diabetic chronic kidney disease: Secondary | ICD-10-CM | POA: Diagnosis not present

## 2024-10-05 DIAGNOSIS — N1831 Chronic kidney disease, stage 3a: Secondary | ICD-10-CM | POA: Diagnosis not present

## 2024-10-11 DIAGNOSIS — M6281 Muscle weakness (generalized): Secondary | ICD-10-CM | POA: Diagnosis not present

## 2024-10-11 DIAGNOSIS — M5442 Lumbago with sciatica, left side: Secondary | ICD-10-CM | POA: Diagnosis not present

## 2024-10-11 DIAGNOSIS — G8929 Other chronic pain: Secondary | ICD-10-CM | POA: Diagnosis not present

## 2024-10-11 DIAGNOSIS — M5441 Lumbago with sciatica, right side: Secondary | ICD-10-CM | POA: Diagnosis not present

## 2024-10-13 DIAGNOSIS — E559 Vitamin D deficiency, unspecified: Secondary | ICD-10-CM | POA: Diagnosis not present

## 2024-10-13 DIAGNOSIS — Z720 Tobacco use: Secondary | ICD-10-CM | POA: Diagnosis not present

## 2024-10-13 DIAGNOSIS — M810 Age-related osteoporosis without current pathological fracture: Secondary | ICD-10-CM | POA: Diagnosis not present

## 2024-10-17 DIAGNOSIS — M5442 Lumbago with sciatica, left side: Secondary | ICD-10-CM | POA: Diagnosis not present

## 2024-10-17 DIAGNOSIS — G8929 Other chronic pain: Secondary | ICD-10-CM | POA: Diagnosis not present

## 2024-10-17 DIAGNOSIS — M5441 Lumbago with sciatica, right side: Secondary | ICD-10-CM | POA: Diagnosis not present

## 2024-10-17 DIAGNOSIS — M6281 Muscle weakness (generalized): Secondary | ICD-10-CM | POA: Diagnosis not present

## 2024-10-24 NOTE — Progress Notes (Signed)
 Telephone Visit- Progress Note: Referring Physician:  Don Lauraine Collar, NP [address] [place],  [place] [address]  Primary Physician:  Don Lauraine Collar, NP  This visit was performed via telephone.  Patient location: home Provider location: working from home  I spent a total of 15 minutes non-face-to-face activities for this visit on the date of this encounter including review of current clinical condition and response to treatment.    Patient has given verbal consent to this telephone visits and we reviewed the limitations of a telephone visit. Patient wishes to proceed.    Chief Complaint:  follow up  History of Present Illness: Kim Reynolds is a 68 y.o. female has a history of  CKD stage 3, depression, DM, hyperlipidemia, IBS, osteoporosis.    She has diffuse neck and back pain x years.   She has constant pain into mid and lower back with constant posterior left leg pain mostly to her knee, but can go into calf. No right leg pain. No radiation of pain into the ribs.    She has significant severe TL scoliosis with mild/moderate central stenosis L3-L4 with mild bilateral foraminal stenosis, mild central stenosis L4-L5 with moderate left foraminal stenosis.    She was to start PT, quit smoking, and get repeat DEXA after her last visit. Also discussed that HgbA1c would need to be below 7.5 prior to any surgery.   She had DEXA on 10/15/24 that showed osteoporosis- she sees endocrine and continues on prolia .   She had initial PT evaluation on 09/25/24 with 5 more visits through 11/01/24.   Still smoking 1/2 ppd but working on quitting.   Her last HgbA1c on 10/05/24 was 7.4.   She is about the same. She continues with constant pain into mid and lower back with left leg pain.    Primary complaint is her lower back and left leg pain. Left leg pain is anterior to knee and then medial to her foot. No numbness, tingling, or weakness in her legs. No right leg pain.  She continues with constant mid back pain, no radiation to her ribs.   She is not getting any better with PT.   Tobacco use: smokes 1/2 PPD x 50+ years.    Bowel/Bladder Dysfunction: none   Conservative measures:  Physical therapy:  PT at [facility] in [place] initial evaluation on 09/25/24 with 5 visits through 11/01/24.  Multimodal medical therapy including regular antiinflammatories: cymbalta, gabapentin Injections:  02/25/2024: left L4-5 and L5-S1 TF ESI (no relief) 09/24/23: Left L4-5 and L5-S1 TF ESI (85% relief)   Past Surgery: no spinal surgeries  Exam: No exam done as this was a telephone encounter.     Imaging: none  Assessment and Plan: Ms. Klinkner is about the same. She continues with constant pain into mid and lower back with left leg pain.    Primary complaint is her lower back and left leg pain. Left leg pain is anterior to knee and then medial to her foot. No numbness, tingling, or weakness in her legs. No right leg pain. She continues with constant mid back pain, no radiation to her ribs.    She has significant severe TL scoliosis with mild/moderate central stenosis L3-L4 with mild bilateral foraminal stenosis, mild central stenosis L4-L5 with moderate left foraminal stenosis.   Imaging reviewed with Dr. Clois previously. Surgery to correct her deformity would be T2-pelvis fusion- she is not interested in this. It is possible to consider a more limited approach.  Treatment options discussed with patient and following plan made:   - She will do at least 2 more weeks of PT.  - She will continue to work on smoking cessation.  - She had DEXA on 10/15/24 that showed osteoporosis- she sees endocrine and continues on prolia .  - Her last HgbA1c on 10/05/24 was 7.4.  - Follow up scheduled with Dr. Clois after the holidays to discuss possible surgery options.   Glade Boys PA-C Neurosurgery

## 2024-10-27 ENCOUNTER — Encounter (INDEPENDENT_AMBULATORY_CARE_PROVIDER_SITE_OTHER): Payer: Self-pay

## 2024-11-01 DIAGNOSIS — M5441 Lumbago with sciatica, right side: Secondary | ICD-10-CM | POA: Diagnosis not present

## 2024-11-01 DIAGNOSIS — G8929 Other chronic pain: Secondary | ICD-10-CM | POA: Diagnosis not present

## 2024-11-01 DIAGNOSIS — M6281 Muscle weakness (generalized): Secondary | ICD-10-CM | POA: Diagnosis not present

## 2024-11-01 DIAGNOSIS — M5442 Lumbago with sciatica, left side: Secondary | ICD-10-CM | POA: Diagnosis not present

## 2024-11-01 NOTE — Progress Notes (Signed)
 [facility]                    PT Daily Progress Note  Patient's Name:Kim Reynolds                        MRN #[id number]      Date:11/01/2024  Visit Number: 5  Progress Related to Long and Short Term Goals:                                                                                            [] AROM:             [] PROM:     [] Strength:                                [] Pain -     while- [] at rest    [] During activity:  []  Functional goal:    []  Other:    []  Balance:     Patient / Family Education:    []  Progressed HEP to include:  []  Reviewed previous HEP:   ___________________________________________________________________________________   Skilled Service Provided:  To: []  right   []  left     []  bilateral    []   core/body stability and balance  []  neck   []   shoulder   []  upper arm/  []  elbow   []  lower arm  []  wrist   []  hand   []   fingers  []  back     []  hip    []  upper leg    []  knee  []  lower leg   []   ankle  foot             Ther Ex Therapeutic Procedure CPT 97110 : 40 minutes         Manual Tx Manual Therapy CPT 97140: 10 minutes     NeuroMusc          Ther Act         ADL/HEP          Gait Trng            Mech Trax    min        [x]  Inc ROM [x] Inc joint mob []  Inc ROM [] Inc ROM []  Inc Posture [] Inc Bal [] Dec pain  [x]  Inc Flex [x]  Inc Flex []  Inc Flex [] Inc Flex [] Dec pain [] Dec pain [] Inc align  [employer]  Bal [x]  Inc ROM [] Inc Strength  [] Inc Strength          [] Inc Bal [] Normalized Gait  [] Inc mob  [x]  Inc Strength       [] Astym [] Inc Bal [] [employer] [] Prevent     Injury    Progressive  HEP          []   [] tension/ spasms/soft tissue mob [] Inc pain   During: [] Lifting [] Reaching [] Bending [] Sitting [] Sit to stand [] Twisting [] Pulling [] Pushing [] Inc function    [] Inc Posture [] Correct malaign [] Dec tone  []   Progressive functional HEP                E-Stim           US  x  min             Ice / Heat  Hot / Cold Modality  CPT 97010: Performed              Vaso-pneumaticcompress               Infrared            IONTO                  [] Dec inflam [] Inc Circ  [] Dec pain [] Dec swelling Setting:     [] Dec pain [] Dec pain [] Dec inflam [] Dec edema [] Inc Healing   [] Dec swelling [] Dec swelling [] Dec swelling    Level      []  1        []  2      []  3 [] Dec pain   [] Inc circulation [] Dec inflam [] Inc circulation Temp:         F [] Inc circulation   [] Inc tissue mob  [] Inc soft tissue mob     Settings:        Subjective: Not really changed much.  Does report doing a lot of decorating and activity for Thanksgiving and may not be helping. Treatment notes:   Started off with moist heat in prone position without reported symptoms.  She is able to complete prone press ups without any radicular symptoms or pain whatsoever.  Continued with mobilizations to assist with lumbar extension.  Used overpressure with her lumbar press ups as well.  Continued with some core stabilization exercises. Assessment of Treatment:    Advised modifying her daily routine as we discussed in the beginning as this may be a limiter to fully resolving her symptoms as she is able to centralize her in clinic Patient's Response to Treatment:  [] Dec pain  [] Inc pain    []  Inc flexibility    [] Inc endurance   []  Inc strength     []  Inc AROM      []  Inc PROM       []  Dec Spasms    [] Inc Posture / alignment     []  Inc Joint Mobility   []  Other:   Functional Improvement Noted:  Increased ability to  []  walk   []  stand for longer periods   []  dress with less help    []  lift    []   reach   []   bend  []  Other:  Remaining Impairment Requiring Continued Treatment:   [x]  Dec  flexibility   [x]  Dec  ROM   [x]   pain   [x]  Dec strength  [x]   weakness    [x]   Dec balance   [x]   swelling   [x]   inflammation   []   Spasm    []  posture/alignment    []   Other:  Plan: [x]   Continue Plan of Care        []   Add:          []   Discharge after next visit    []   Discharge to HEP   []  Other:  PT Billing Documentation Date of Onset: 09/11/24 Visit Number: 5     Frequently Used Timed Codes Therapeutic Procedure CPT 97110 : 40 minutes Manual Therapy CPT 97140: 10 minutes       Hot / Cold Modality  CPT 97010: Performed        Total Treatment Time: 60 minutes     Therapist's : MATTHEW D Paulakonis, PT  This note was generated in part with voice recognition software and I apologize for any typographical errors that were not detected and corrected.  Back Treatment Log   Date 10/27 11/5 11/12 11/18 12/3       Bike/Stepper (seat_____)            Treadmill            Leg Press            Wall Slides            Multi-Hip Flex            Multi-Hip Ext            Multi-Hip Abd            Multi-Hip Add            Knee Extension            Leg Curls            Standing shoulder flex            Standing shoulder abd            SLR    2/10 3/10       Marching    L1.5 3/10 L1.5 3/10       Bridges            Clam Shell     Gtb 3/10       Abdmonial Isometrics w/ball            Partial sit-ups            Pelvic Tilts            Prone Prop/ Prone Push Up  Prop 3x1' L closed 1x1' prop, 3/10 2/10, 1/10 op 3/10 op       SKC/Piriformis/HAMS/QUADS  12 12 12 12        PA and rot mobs T-L spine grade 2-3  3/10 3/10 3/10 3/10                                                                                                       Ultrasound/ Infrared            HP/CP with/ without  IFC  10 hp 10 hp 10 hp 10 hp

## 2024-11-03 ENCOUNTER — Encounter: Payer: Self-pay | Admitting: Orthopedic Surgery

## 2024-11-03 ENCOUNTER — Ambulatory Visit: Admitting: Orthopedic Surgery

## 2024-11-03 DIAGNOSIS — M5416 Radiculopathy, lumbar region: Secondary | ICD-10-CM

## 2024-11-03 DIAGNOSIS — M48062 Spinal stenosis, lumbar region with neurogenic claudication: Secondary | ICD-10-CM | POA: Diagnosis not present

## 2024-11-03 DIAGNOSIS — M4120 Other idiopathic scoliosis, site unspecified: Secondary | ICD-10-CM | POA: Diagnosis not present

## 2024-11-03 DIAGNOSIS — M4726 Other spondylosis with radiculopathy, lumbar region: Secondary | ICD-10-CM

## 2024-11-03 DIAGNOSIS — M47816 Spondylosis without myelopathy or radiculopathy, lumbar region: Secondary | ICD-10-CM

## 2024-11-17 NOTE — Progress Notes (Signed)
 Kim Reynolds                                          MRN: [id number]   11/17/2024   The [facility] Quality Team Specialist reviewed this patient medical record for the purposes of chart review for care gap closure. The following were reviewed: abstraction for care gap closure-kidney health evaluation for diabetes:eGFR  and uACR.    [facility]I Quality Team

## 2024-11-20 ENCOUNTER — Other Ambulatory Visit (INDEPENDENT_AMBULATORY_CARE_PROVIDER_SITE_OTHER): Payer: Self-pay | Admitting: Nurse Practitioner

## 2024-11-20 DIAGNOSIS — I70213 Atherosclerosis of native arteries of extremities with intermittent claudication, bilateral legs: Secondary | ICD-10-CM

## 2024-11-27 ENCOUNTER — Ambulatory Visit (INDEPENDENT_AMBULATORY_CARE_PROVIDER_SITE_OTHER)

## 2024-11-27 ENCOUNTER — Encounter (INDEPENDENT_AMBULATORY_CARE_PROVIDER_SITE_OTHER): Payer: Self-pay | Admitting: Nurse Practitioner

## 2024-11-27 ENCOUNTER — Ambulatory Visit (INDEPENDENT_AMBULATORY_CARE_PROVIDER_SITE_OTHER): Admitting: Nurse Practitioner

## 2024-11-27 VITALS — BP 122/72 | HR 66 | Resp 17 | Ht 64.0 in | Wt 156.0 lb

## 2024-11-27 DIAGNOSIS — E785 Hyperlipidemia, unspecified: Secondary | ICD-10-CM | POA: Diagnosis not present

## 2024-11-27 DIAGNOSIS — E119 Type 2 diabetes mellitus without complications: Secondary | ICD-10-CM | POA: Diagnosis not present

## 2024-11-27 DIAGNOSIS — I70213 Atherosclerosis of native arteries of extremities with intermittent claudication, bilateral legs: Secondary | ICD-10-CM | POA: Diagnosis not present

## 2024-11-27 NOTE — Progress Notes (Signed)
 "   Referring Physician:  Don Lauraine Collar, NP [address] [place],  [place] [address]  Primary Physician:  Don Lauraine Collar, NP  History of Present Illness: 12/05/2024 Ms. Milani Lowenstein is here today with a chief complaint of idiopathic scoliosis, lumbar spondylosis, osteoporosis, and chronic kidney disease who presents for evaluation of progressive lower back pain and functional limitations.  She has had intermittent lower back pain for several years, now gradually worsening and localized mainly to the lower left side with radiation down the posterior left leg. Leg pain now limits her to walking about 500 feet, she cannot sit longer than 2 hours, and pain improves only with lying flat, ice, and leg elevation. She continues daily activities but with increased pain.  She has tried physical therapy, spinal injections, and multiple medications including muscle relaxants (Flexeril), gabapentin, ibuprofen, naproxen, and acetaminophen without meaningful relief. The first injection helped, but a later injection caused a significant pain flare. She is not currently using muscle relaxants.  She has osteoporosis treated with at least four doses of Prolia , chronic kidney disease, well controlled diabetes, and vascular disease in her legs managed by vascular clinic. She is a current smoker.  Discussed the use of AI scribe software for clinical note transcription with the patient, who gave verbal consent to proceed.  Discharged from PT at [facility] on 11/06/24.  Progress Note from Glade Boys, [place] on 11/03/24:   History of Present Illness: Jackquelyn Sundberg is a 68 y.o. female has a history of  CKD stage 3, depression, DM, hyperlipidemia, IBS, osteoporosis.    She has diffuse neck and back pain x years.    She has constant pain into mid and lower back with constant posterior left leg pain mostly to her knee, but can go into calf. No right leg pain. No radiation of pain into the ribs.     She has significant severe TL scoliosis with mild/moderate central stenosis L3-L4 with mild bilateral foraminal stenosis, mild central stenosis L4-L5 with moderate left foraminal stenosis.    She was to start PT, quit smoking, and get repeat DEXA after her last visit. Also discussed that HgbA1c would need to be below 7.5 prior to any surgery.    She had DEXA on 10/15/24 that showed osteoporosis- she sees endocrine and continues on prolia .    She had initial PT evaluation on 09/25/24 with 5 more visits through 11/01/24.    Still smoking 1/2 ppd but working on quitting.    Her last HgbA1c on 10/05/24 was 7.4.    She is about the same. She continues with constant pain into mid and lower back with left leg pain.    Primary complaint is her lower back and left leg pain. Left leg pain is anterior to knee and then medial to her foot. No numbness, tingling, or weakness in her legs. No right leg pain. She continues with constant mid back pain, no radiation to her ribs.    She is not getting any better with PT.    Tobacco use: smokes 1/2 PPD x 50+ years.    Bowel/Bladder Dysfunction: none   Conservative measures:  Physical therapy:  PT at [facility] in [place] initial evaluation on 09/25/24 with 5 visits through 11/01/24.  Multimodal medical therapy including regular antiinflammatories: cymbalta, gabapentin Injections:  02/25/2024: left L4-5 and L5-S1 TF ESI (no relief) 09/24/23: Left L4-5 and L5-S1 TF ESI (85% relief)   Past Surgery: no spinal surgeries  Review of Systems:  A 10 point review of systems is negative, except for the pertinent positives and negatives detailed in the HPI.  Past Medical History: Past Medical History:  Diagnosis Date   Allergies    Anxiety    Depression    Hx of hysterectomy    Hypercholesterolemia    Hypertension    Osteoporosis    Renal insufficiency     Past Surgical History: Past Surgical History:  Procedure Laterality Date   ABDOMINAL HYSTERECTOMY      BREAST BIOPSY Right 2003   neg   CATARACT EXTRACTION W/PHACO Right 09/17/2020   Procedure: CATARACT EXTRACTION PHACO AND INTRAOCULAR LENS PLACEMENT (IOC) RIGHT 6.40 00:42.9;  Surgeon: Jaye Fallow, MD;  Location: [facility];  Service: Ophthalmology;  Laterality: Right;   DILATION AND CURETTAGE OF UTERUS      Allergies: Allergies as of 12/05/2024 - Review Complete 12/05/2024  Allergen Reaction Noted   Alendronate Other (See Comments) 10/14/2020   Aspirin Other (See Comments) 12/05/2024   Cholecalciferol Nausea Only 07/26/2014   Codeine  09/10/2020    Medications: Current Medications[1]  Social History: Social History[2]  Family Medical History: No family history on file.  Physical Examination: Vitals:   12/05/24 0855  BP: 124/62    General: Patient is in no apparent distress. Attention to examination is appropriate.  Neck:   Supple.  Full range of motion.  Respiratory: Patient is breathing without any difficulty.   NEUROLOGICAL:     Awake, alert, oriented to person, place, and time.  Speech is clear and fluent.   Cranial Nerves: Pupils equal round and reactive to light.  Facial tone is symmetric.  Facial sensation is symmetric. Shoulder shrug is symmetric. Tongue protrusion is midline.  There is no pronator drift.  Strength: Side Biceps Triceps Deltoid Interossei Grip Wrist Ext. Wrist Flex.  R 5 5 5 5 5 5 5   L 5 5 5 5 5 5 5    Side Iliopsoas Quads Hamstring PF DF EHL  R 5 5 5 5 5 5   L 5 5 5 5 5 5    Reflexes are 1+ and symmetric at the biceps, triceps, brachioradialis, patella and achilles.   Hoffman's is absent.   Bilateral upper and lower extremity sensation is intact to light touch.    No evidence of dysmetria noted.  Gait is normal.     Medical Decision Making  Imaging: MR L spine 07/29/2024  IMPRESSION: 1. Overall, little interval change in appearance of the lumbar spine as compared to 08/24/2023. 2. Multifactorial degenerative  changes at L3-4 with resultant mild to moderate spinal stenosis, with mild bilateral L3 foraminal narrowing. 3. Disc bulge with facet hypertrophy at L4-5 with resultant mild canal and moderate left lateral recess stenosis, with moderate left L4 foraminal narrowing. 4. Moderate to severe levoscoliosis, apex at L2-3.     Electronically Signed   By: Morene Hoard M.D.   On: 07/29/2024 19:43   IMPRESSION: Severe dextrolevoscoliosis of the thoracolumbar spine, dextroscoliosis of the thoracic spine apex T8, Cobb angle 53 degrees, levoscoliosis of the lumbar spine apex L3-L4, Cobb angle 47 degrees.     Electronically Signed   By: Marolyn JONETTA Jaksch M.D.   On: 08/24/2024 09:21   SVA within normal limits.  Coronal plane imbalance present - R shift of spine ~3.6 cm  Dexa -2.3 L hip  I have personally reviewed the images and agree with the above interpretation.  Assessment and Plan: Ms. Pomeroy is a pleasant 68 y.o. female with adolescent idiopathic  scoliosis with a coronal plane abnormality.  She has some low back pain from this.  Fortunately, it is not prohibitive at this time.  We discussed that I would not recommend surgical intervention at this time.  She would need to discontinue using nicotine products to consider surgical intervention.  Additionally, I do not think that her symptoms are currently bad enough to warrant this extensive of an intervention.  Will continue to monitor her curve.  I will see her back in 6 months with scoliosis x-rays.  I spent a total of 30 minutes in this patient's care today. This time was spent reviewing pertinent records including imaging studies, obtaining and confirming history, performing a directed evaluation, formulating and discussing my recommendations, and documenting the visit within the medical record.      Thank you for involving me in the care of this patient.      Davi Rotan K. Clois MD, [facility] Neurosurgery     [1]  Current  Outpatient Medications:    escitalopram (LEXAPRO) 5 MG tablet, Take 5 mg by mouth daily., Disp: , Rfl:    loratadine (CLARITIN) 10 MG tablet, Take 10 mg by mouth daily., Disp: , Rfl:    losartan-hydrochlorothiazide (HYZAAR) 50-12.5 MG tablet, Take 1 tablet by mouth daily., Disp: , Rfl:    metFORMIN (GLUCOPHAGE-XR) 500 MG 24 hr tablet, Take 500 mg by mouth daily with breakfast., Disp: , Rfl:    omeprazole (PRILOSEC) 40 MG capsule, Take 40 mg by mouth daily., Disp: , Rfl:    PROLIA  60 MG/ML SOSY injection, Inject 60 mg into the skin., Disp: , Rfl:    rosuvastatin (CRESTOR) 5 MG tablet, Take 5 mg by mouth 3 (three) times a week., Disp: , Rfl:  [2]  Social History Tobacco Use   Smoking status: Every Day    Current packs/day: 0.50    Average packs/day: 0.5 packs/day for 55.0 years (27.5 ttl pk-yrs)    Types: Cigarettes    Start date: 32   Smokeless tobacco: Never  Vaping Use   Vaping status: Never Used  Substance Use Topics   Alcohol use: Not Currently   Drug use: Not Currently   "

## 2024-11-27 NOTE — Progress Notes (Signed)
 "  Subjective:    Patient ID: Kim Reynolds, female    DOB: 22-Mar-1956, 68 y.o.   MRN: [id number] Chief Complaint  Patient presents with   Follow-up    3 months + ABI     HPI  Discussed the use of AI scribe software for clinical note transcription with the patient, who gave verbal consent to proceed.  History of Present Illness Vianney Kopecky is a 68 year old female with peripheral arterial disease who presents for follow-up of bilateral lower extremity claudication.  She experiences intermittent bilateral lower extremity pain precipitated by ambulation. Symptoms occur after walking short distances, such as in a grocery store, and limit her ability to walk for extended periods. She uses a sitting elliptical daily but is unable to tolerate prolonged walking.   She discontinued aspirin several weeks ago due to persistent dyspepsia and gastric discomfort. No other medication changes are reported.  She denies rest pain, nocturnal leg pain, awakening at night due to leg discomfort, dependent positioning for relief, nocturnal cramping, or lower extremity wounds, ulcers, or non-healing lesions.  She has undergone serial vascular assessments, including ankle-brachial index and toe pressure measurements, with her last evaluation approximately three months ago.    Results Diagnostic ABI (11/27/2024): Right 0.62 (increased from 0.57 on 08/25/2024), left 0.85 (unchanged from 0.85 on 08/25/2024) Toe pressure (11/27/2024): Right 0.46 (increased from 0.27 on 08/25/2024), left 0.60 (increased from 0.54 on 08/25/2024) Lower extremity arterial waveforms (11/27/2024): Right leg: biphasic and monophasic waveforms in multiple vessels; left leg: triphasic and biphasic waveforms   Review of Systems  Cardiovascular:  Negative for leg swelling.       Claudication   All other systems reviewed and are negative.      Objective:   Physical Exam Vitals reviewed.  HENT:     Head:  Normocephalic.  Cardiovascular:     Rate and Rhythm: Normal rate.     Pulses:          Dorsalis pedis pulses are detected w/ Doppler on the right side and detected w/ Doppler on the left side.       Posterior tibial pulses are detected w/ Doppler on the right side and detected w/ Doppler on the left side.  Pulmonary:     Effort: Pulmonary effort is normal.  Skin:    General: Skin is warm and dry.  Neurological:     Mental Status: She is alert and oriented to person, place, and time.  Psychiatric:        Mood and Affect: Mood normal.        Behavior: Behavior normal.        Thought Content: Thought content normal.        Judgment: Judgment normal.     BP 122/72   Pulse 66   Resp 17   Ht 5' 4 (1.626 m)   Wt 156 lb (70.8 kg)   BMI 26.78 kg/m   Past Medical History:  Diagnosis Date   Allergies    Anxiety    Depression    Hx of hysterectomy    Hypercholesterolemia    Hypertension    Osteoporosis    Renal insufficiency     Social History   Socioeconomic History   Marital status: Married    Spouse name: Not on file   Number of children: Not on file   Years of education: Not on file   Highest education level: Not on file  Occupational History   Not on  file  Tobacco Use   Smoking status: Every Day   Smokeless tobacco: Never  Vaping Use   Vaping status: Never Used  Substance and Sexual Activity   Alcohol use: Not Currently   Drug use: Not Currently   Sexual activity: Not on file  Other Topics Concern   Not on file  Social History Narrative   Not on file   Social Drivers of Health   Tobacco Use: High Risk (11/27/2024)   Patient History    Smoking Tobacco Use: Every Day    Smokeless Tobacco Use: Never    Passive Exposure: Not on file  Financial Resource Strain: Low Risk  (10/13/2024)   Received from [facility]   Overall Financial Resource Strain (CARDIA)    Difficulty of Paying Living Expenses: Not hard at all  Food Insecurity: No  Food Insecurity (10/13/2024)   Received from [facility]   Epic    Within the past 12 months, you worried that your food would run out before you got the money to buy more.: Never true    Within the past 12 months, the food you bought just didn't last and you didn't have money to get more.: Never true  Transportation Needs: No Transportation Needs (10/13/2024)   Received from [facility] - Transportation    In the past 12 months, has lack of transportation kept you from medical appointments or from getting medications?: No    Lack of Transportation (Non-Medical): No  Physical Activity: Not on file  Stress: Not on file  Social Connections: Not on file  Intimate Partner Violence: Not on file  Depression (EYV7-0): Not on file  Alcohol Screen: Not on file  Housing: Low Risk  (10/13/2024)   Received from [facility]   Epic    In the last 12 months, was there a time when you were not able to pay the mortgage or rent on time?: No    In the past 12 months, how many times have you moved where you were living?: 0    At any time in the past 12 months, were you homeless or living in a shelter (including now)?: No  Utilities: Not At Risk (10/13/2024)   Received from [facility]   Epic    In the past 12 months has the electric, gas, oil, or water company threatened to shut off services in your home?: No  Health Literacy: Not on file    Past Surgical History:  Procedure Laterality Date   ABDOMINAL HYSTERECTOMY     BREAST BIOPSY Right 2003   neg   CATARACT EXTRACTION W/PHACO Right 09/17/2020   Procedure: CATARACT EXTRACTION PHACO AND INTRAOCULAR LENS PLACEMENT (IOC) RIGHT 6.40 00:42.9;  Surgeon: Jaye Fallow, MD;  Location: [facility];  Service: Ophthalmology;  Laterality: Right;   DILATION AND CURETTAGE OF UTERUS      History reviewed. No pertinent family history.  Allergies[1]      No data to  display            CMP  No results found for: NA, K, CL, CO2, GLUCOSE, BUN, CREATININE, CALCIUM, PROT, ALBUMIN, AST, ALT, ALKPHOS, BILITOT, GFR, EGFR, GFRNONAA   No results found.     Assessment & Plan:   1. Atherosclerosis of native artery of both lower extremities with intermittent claudication (Primary) Atherosclerosis of native arteries of both lower extremities with intermittent claudication Chronic peripheral arterial disease with stable intermittent  claudication, mild improvement on the right, stable on the left. Persistent biphasic and monophasic waveforms on the right, triphasic/biphasic on the left.  - Continue daily walking and physical activity, gradually increase distance and treadmill speed without incline. - Use indoor walking options during cold weather. - Emphasized walking's role in collateral vessel formation and symptom improvement. - Reviewed intervention criteria: decreased walking distance, lifestyle-limiting symptoms, rest pain, or non-healing wounds/ulcers. - Notify office if symptoms worsen or new symptoms develop. - Follow-up in six months due to disease stability. - Discontinued aspirin due to gastrointestinal side effects; discussed potential future need for antiplatelet therapy and proton pump inhibitor if intervention is indicated. - VAS US  ABI WITH/WO TBI; Future  2. Type 2 diabetes mellitus without complication, without long-term current use of insulin (HCC) Continue hypoglycemic medications as already ordered, these medications have been reviewed and there are no changes at this time.  Hgb A1C to be monitored as already arranged by primary service  3. Hyperlipidemia, unspecified hyperlipidemia type Continue statin as ordered and reviewed, no changes at this time    Current Outpatient Medications on File Prior to Visit  Medication Sig Dispense Refill   escitalopram (LEXAPRO) 5 MG tablet Take 5 mg by mouth  daily.     loratadine (CLARITIN) 10 MG tablet Take 10 mg by mouth daily.     losartan-hydrochlorothiazide (HYZAAR) 50-12.5 MG tablet Take 1 tablet by mouth daily.     metFORMIN (GLUCOPHAGE-XR) 500 MG 24 hr tablet Take 500 mg by mouth daily with breakfast.     omeprazole (PRILOSEC) 40 MG capsule Take 40 mg by mouth daily.     PROLIA  60 MG/ML SOSY injection Inject 60 mg into the skin.     rosuvastatin (CRESTOR) 5 MG tablet Take 5 mg by mouth 3 (three) times a week.     gabapentin (NEURONTIN) 100 MG capsule Take 100 mg by mouth 2 (two) times daily.     No current facility-administered medications on file prior to visit.    There are no Patient Instructions on file for this visit. Return in about 6 months (around 05/28/2025) for with ABI see JD/FB.   Zikeria Keough E Tanis Hensarling, NP      [1]  Allergies Allergen Reactions   Alendronate Other (See Comments)    Severe Left hip pain   Cholecalciferol Nausea Only   Codeine     Rapid heart breat; break out in a sweat   "

## 2024-11-29 LAB — VAS US ABI WITH/WO TBI
Left ABI: 0.85
Right ABI: 0.62

## 2024-12-05 ENCOUNTER — Ambulatory Visit: Admitting: Neurosurgery

## 2024-12-05 ENCOUNTER — Encounter: Payer: Self-pay | Admitting: Neurosurgery

## 2024-12-05 VITALS — BP 124/62 | Ht 64.0 in | Wt 149.4 lb

## 2024-12-05 DIAGNOSIS — M438X9 Other specified deforming dorsopathies, site unspecified: Secondary | ICD-10-CM

## 2024-12-05 DIAGNOSIS — M47816 Spondylosis without myelopathy or radiculopathy, lumbar region: Secondary | ICD-10-CM | POA: Diagnosis not present

## 2024-12-05 DIAGNOSIS — M4120 Other idiopathic scoliosis, site unspecified: Secondary | ICD-10-CM | POA: Diagnosis not present

## 2024-12-05 MED ORDER — METHOCARBAMOL 500 MG PO TABS: 500.0000 mg | ORAL_TABLET | Freq: Four times a day (QID) | ORAL | 0 refills | Status: AC | PRN

## 2025-05-28 ENCOUNTER — Ambulatory Visit (INDEPENDENT_AMBULATORY_CARE_PROVIDER_SITE_OTHER): Admitting: Nurse Practitioner

## 2025-05-28 ENCOUNTER — Encounter (INDEPENDENT_AMBULATORY_CARE_PROVIDER_SITE_OTHER)

## 2025-06-05 ENCOUNTER — Ambulatory Visit: Admitting: Neurosurgery
